# Patient Record
Sex: Female | Born: 1965
Health system: Southern US, Community
[De-identification: ages and names within clinical notes are randomized; demographics above are authoritative.]

## PROBLEM LIST (undated history)

## (undated) DIAGNOSIS — R32 Unspecified urinary incontinence: Secondary | ICD-10-CM

## (undated) DIAGNOSIS — J45909 Unspecified asthma, uncomplicated: Secondary | ICD-10-CM

## (undated) DIAGNOSIS — I447 Left bundle-branch block, unspecified: Secondary | ICD-10-CM

## (undated) DIAGNOSIS — N939 Abnormal uterine and vaginal bleeding, unspecified: Secondary | ICD-10-CM

## (undated) DIAGNOSIS — E7800 Pure hypercholesterolemia, unspecified: Secondary | ICD-10-CM

## (undated) DIAGNOSIS — L309 Dermatitis, unspecified: Secondary | ICD-10-CM

## (undated) HISTORY — DX: Pure hypercholesterolemia, unspecified: E78.00

## (undated) HISTORY — DX: Dermatitis, unspecified: L30.9

## (undated) HISTORY — DX: Unspecified asthma, uncomplicated: J45.909

## (undated) HISTORY — DX: Unspecified urinary incontinence: R32

## (undated) HISTORY — DX: Abnormal uterine and vaginal bleeding, unspecified: N93.9

## (undated) HISTORY — DX: Left bundle-branch block, unspecified: I44.7

---

## 1994-04-28 HISTORY — PX: APPENDECTOMY: SHX54

## 2016-08-03 DIAGNOSIS — R7303 Prediabetes: Secondary | ICD-10-CM | POA: Insufficient documentation

## 2017-01-30 DIAGNOSIS — E041 Nontoxic single thyroid nodule: Secondary | ICD-10-CM | POA: Insufficient documentation

## 2017-06-22 ENCOUNTER — Ambulatory Visit: Payer: Self-pay | Admitting: Family Medicine

## 2017-06-22 ENCOUNTER — Ambulatory Visit: Payer: Self-pay | Admitting: Internal Medicine

## 2017-07-01 ENCOUNTER — Other Ambulatory Visit: Payer: Self-pay

## 2017-07-01 ENCOUNTER — Encounter: Payer: Self-pay | Admitting: Internal Medicine

## 2017-07-01 ENCOUNTER — Ambulatory Visit (INDEPENDENT_AMBULATORY_CARE_PROVIDER_SITE_OTHER): Payer: No Typology Code available for payment source | Admitting: Internal Medicine

## 2017-07-01 VITALS — BP 110/72 | HR 68 | Temp 98.3°F | Ht 67.0 in | Wt 140.8 lb

## 2017-07-01 DIAGNOSIS — H8110 Benign paroxysmal vertigo, unspecified ear: Secondary | ICD-10-CM

## 2017-07-01 DIAGNOSIS — F32A Depression, unspecified: Secondary | ICD-10-CM | POA: Insufficient documentation

## 2017-07-01 DIAGNOSIS — J45909 Unspecified asthma, uncomplicated: Secondary | ICD-10-CM | POA: Diagnosis not present

## 2017-07-01 DIAGNOSIS — M7502 Adhesive capsulitis of left shoulder: Secondary | ICD-10-CM

## 2017-07-01 DIAGNOSIS — N898 Other specified noninflammatory disorders of vagina: Secondary | ICD-10-CM | POA: Diagnosis not present

## 2017-07-01 DIAGNOSIS — F329 Major depressive disorder, single episode, unspecified: Secondary | ICD-10-CM | POA: Diagnosis not present

## 2017-07-01 MED ORDER — SERTRALINE HCL 100 MG PO TABS: 100.0000 mg | ORAL_TABLET | Freq: Every day | ORAL | 3 refills | Status: DC

## 2017-07-01 MED ORDER — MICONAZOLE NITRATE 2 % VA CREA: 1.0000 | TOPICAL_CREAM | Freq: Every day | VAGINAL | 0 refills | Status: DC

## 2017-07-01 NOTE — Patient Instructions (Addendum)
Please return for an injection of your left shoulder for pain relief. I have put in the referral for physical therapy.   I have prescribed Miconazole cream to use nightly for 7 nights for a presumed yeast infection.   How to Perform the Epley Maneuver The Epley maneuver is an exercise that relieves symptoms of vertigo. Vertigo is the feeling that you or your surroundings are moving when they are not. When you feel vertigo, you may feel like the room is spinning and have trouble walking. Dizziness is a little different than vertigo. When you are dizzy, you may feel unsteady or light-headed. You can do this maneuver at home whenever you have symptoms of vertigo. You can do it up to 3 times a day until your symptoms go away. Even though the Epley maneuver may relieve your vertigo for a few weeks, it is possible that your symptoms will return. This maneuver relieves vertigo, but it does not relieve dizziness. What are the risks? If it is done correctly, the Epley maneuver is considered safe. Sometimes it can lead to dizziness or nausea that goes away after a short time. If you develop other symptoms, such as changes in vision, weakness, or numbness, stop doing the maneuver and call your health care provider. How to perform the Epley maneuver 1. Sit on the edge of a bed or table with your back straight and your legs extended or hanging over the edge of the bed or table. 2. Turn your head halfway toward the affected ear or side. 3. Lie backward quickly with your head turned until you are lying flat on your back. You may want to position a pillow under your shoulders. 4. Hold this position for 30 seconds. You may experience an attack of vertigo. This is normal. 5. Turn your head to the opposite direction until your unaffected ear is facing the floor. 6. Hold this position for 30 seconds. You may experience an attack of vertigo. This is normal. Hold this position until the vertigo stops. 7. Turn your whole  body to the same side as your head. Hold for another 30 seconds. 8. Sit back up. You can repeat this exercise up to 3 times a day. Follow these instructions at home:  After doing the Epley maneuver, you can return to your normal activities.  Ask your health care provider if there is anything you should do at home to prevent vertigo. He or she may recommend that you: ? Keep your head raised (elevated) with two or more pillows while you sleep. ? Do not sleep on the side of your affected ear. ? Get up slowly from bed. ? Avoid sudden movements during the day. ? Avoid extreme head movement, like looking up or bending over. Contact a health care provider if:  Your vertigo gets worse.  You have other symptoms, including: ? Nausea. ? Vomiting. ? Headache. Get help right away if:  You have vision changes.  You have a severe or worsening headache or neck pain.  You cannot stop vomiting.  You have new numbness or weakness in any part of your body. Summary  Vertigo is the feeling that you or your surroundings are moving when they are not.  The Epley maneuver is an exercise that relieves symptoms of vertigo.  If the Epley maneuver is done correctly, it is considered safe. You can do it up to 3 times a day. This information is not intended to replace advice given to you by your health care provider.  Make sure you discuss any questions you have with your health care provider. Document Released: 04/19/2013 Document Revised: 03/04/2016 Document Reviewed: 03/04/2016 Elsevier Interactive Patient Education  2017 [employer].

## 2017-07-01 NOTE — Progress Notes (Signed)
Subjective:    Autumn CrandallYasemin Brown - 52 y.o. female MRN [id number]  Date of birth: 1966-03-04  HPI  Autumn CrandallYasemin Brown is here to establish care.   Shoulder Pain: Chronic, left sided. Has been followed by PT for adhesive capsulitis. Still with limited ROM at left shoulder. Had injection done several months ago. Requests new referral for PT.   Dizziness: Has been occurring intermitently for several months. Sensation of room spinning when she changes position of her head quickly. No lightheadedness, headache, palpitations, nausea/vomiting, hearing loss, vision changes, weakness in an extremity. No head trauma, falls, or LOC. Has not tried any medications.   Vaginal Itching: Reports vaginal itching with white discharge for the past few weeks. She was treated with Metronidazole cream fairly recently. No abnormal uterine bleeding, dysuria, pelvic pain. Mirena in place.    PMH: Depression Asthma  Medications: Zoloft 100 mg daily   Social Hx:  Married. Two daughters.  Never smoker.  No EtOH or drug use.  No exercise.  Feels safe.  Not at risk for STD.   Surgical Hx:  Appendectomy 1996   Family Hx: Noted and updated in EMR.   ROS:  Patient reports no  vision/ hearing changes,anorexia, weight change, fever ,adenopathy, persistant / recurrent hoarseness, swallowing issues, chest pain, edema,persistant / recurrent cough, hemoptysis, dyspnea(rest, exertional, paroxysmal nocturnal), gastrointestinal  bleeding (melena, rectal bleeding), abdominal pain, excessive heart burn, GU symptoms(dysuria, hematuria, pyuria, voiding/incontinence  Issues) syncope, focal weakness, severe memory loss, concerning skin lesions, depression, anxiety, abnormal bruising/bleeding, major joint swelling, breast masses or abnormal vaginal bleeding.    -  reports that  has never smoked. she has never used smokeless tobacco. - Review of Systems: Per HPI. - Past Medical History: Patient Active Problem List   Diagnosis  Date Noted  . Depression 07/01/2017  . Asthma 07/01/2017   - Medications: reviewed and updated   Objective:   Physical Exam BP 110/72   Pulse 68   Temp 98.3 F (36.8 C) (Oral)   Ht 5\' 7"  (1.702 m)   Wt 140 lb 12.8 oz (63.9 kg)   SpO2 98%   BMI 22.05 kg/m  Gen: NAD, alert, cooperative with exam, well-appearing HEENT: NCAT, PERRL, clear conjunctiva, oropharynx clear, supple neck CV: RRR, good S1/S2, no murmur, no edema, capillary refill brisk  Resp: CTABL, no wheezes, non-labored Abd: SNTND, BS present, no guarding or organomegaly Skin: no rashes, normal turgor  MSK: Active and passive ROM limited in abduction and internal rotation.  Neuro: CN II-XII grossly intact. Strength 5/5 in all extremities. Sensation to all extremities grossly normal. Dix-Hallpike position to right.  GU/GYN: Declined.  Psych: good insight, alert and oriented     Assessment & Plan:   1. Adhesive capsulitis of left shoulder Have recommended patient return for steroid injection. Discussed typical timeline and slow healing of adhesive capsulitis. PT referral placed.  - Ambulatory referral to Physical Therapy  2. Vaginal itching Patient declined exam. Symptoms sound most consistent with yeast infection especially given recent treatment for BV. Will treat as such.  - miconazole (MICONAZOLE 7) 2 % vaginal cream; Place 1 Applicatorful vaginally at bedtime.  Dispense: 45 g; Refill: 0  3. Benign paroxysmal positional vertigo, unspecified laterality History and exam consistent with BPPV. No red flags on history. Low suspicion for intracranial process due to normal neuro exam. Cerebellar stroke also unlikely given normal gait and neurological assessment. Recommended at home Epley maneuvers.    Marcy Sirenatherine Wallace, D.O. 07/01/2017, 1:35 PM PGY-3, [facility]

## 2017-07-08 ENCOUNTER — Ambulatory Visit: Payer: No Typology Code available for payment source | Admitting: Internal Medicine

## 2017-07-14 ENCOUNTER — Ambulatory Visit: Payer: No Typology Code available for payment source

## 2017-07-22 ENCOUNTER — Ambulatory Visit: Payer: No Typology Code available for payment source | Admitting: Physical Therapy

## 2017-07-27 ENCOUNTER — Ambulatory Visit: Payer: No Typology Code available for payment source | Admitting: Internal Medicine

## 2017-07-27 ENCOUNTER — Encounter: Payer: Self-pay | Admitting: Internal Medicine

## 2017-07-27 ENCOUNTER — Ambulatory Visit (INDEPENDENT_AMBULATORY_CARE_PROVIDER_SITE_OTHER): Payer: No Typology Code available for payment source | Admitting: Internal Medicine

## 2017-07-27 ENCOUNTER — Other Ambulatory Visit: Payer: Self-pay

## 2017-07-27 VITALS — BP 104/66 | HR 78 | Temp 98.8°F | Ht 67.0 in | Wt 140.8 lb

## 2017-07-27 DIAGNOSIS — M25561 Pain in right knee: Secondary | ICD-10-CM | POA: Diagnosis not present

## 2017-07-27 DIAGNOSIS — M25512 Pain in left shoulder: Secondary | ICD-10-CM | POA: Diagnosis not present

## 2017-07-27 MED ORDER — SERTRALINE HCL 100 MG PO TABS: 100.0000 mg | ORAL_TABLET | Freq: Every day | ORAL | 3 refills | Status: DC

## 2017-07-27 MED ORDER — METHYLPREDNISOLONE ACETATE 40 MG/ML IJ SUSP
40.0000 mg | Freq: Once | INTRAMUSCULAR | Status: AC
  Administered 2017-07-27: 40 mg via INTRAMUSCULAR

## 2017-07-27 MED ORDER — TRAMADOL HCL 50 MG PO TABS: 50.0000 mg | ORAL_TABLET | Freq: Three times a day (TID) | ORAL | 0 refills | Status: DC | PRN

## 2017-07-27 MED FILL — traMADol HCL 50 MG TABS: 50 | 7 days supply | Qty: 20 | Fill #0

## 2017-07-27 MED FILL — SERTRALINE HCL 100 MG TAB: 100 | 90 days supply | Qty: 90 | Fill #0

## 2017-07-27 NOTE — Progress Notes (Signed)
   Subjective:    Autumn Brown - 52 y.o. female MRN [id number]  Date of birth: 1965/10/20  HPI  Autumn CrandallYasemin Lefebre is here for injection of left shoulder for known adhesive capsulitis discussed at previous visit. Also brings up right knee pain.   Right Knee Pain: Present for the past two weeks. Reports intermittent pain in that knee for the past 2 years. Located in the medial aspect of the knee. Per patient, has history of right medial meniscal tear not requiring surgical repair. No known inciting event or injury two weeks ago that caused flare up of pain. Endorses stiffness of the joint. No popping, locking, giving way. No edema. Strength of right LE preserved. Denies numbness or tingling of right LE. Has been using OTC Ibuprofen but it causes stomach pain and OTC tylenol. Asks for other options for medications.   -  reports that she has never smoked. She has never used smokeless tobacco. - Review of Systems: Per HPI. - Past Medical History: Patient Active Problem List   Diagnosis Date Noted  . Depression 07/01/2017  . Asthma 07/01/2017   - Medications: reviewed and updated   Objective:   Physical Exam BP 104/66   Pulse 78   Temp 98.8 F (37.1 C) (Oral)   Ht 5\' 7"  (1.702 m)   Wt 140 lb 12.8 oz (63.9 kg)   SpO2 98%   BMI 22.05 kg/m  Gen: NAD, alert, cooperative with exam, well-appearing Right Knee: No edema, erythema, or increased warmth. No TTP over the joint lines. Crepitus present with passive ROM. Active ROM is intact. Negative anterior drawer test. No pain with valgus or varus stress testing with stable endpoints to the MCL and LCL. Negative Thessaly's. Strength 5/5 in LE bilaterally. Sensation to LE grossly intact. Normal gait.   INJECTION:  Patient was given informed consent, signed copy in the chart. Appropriate time out was taken. Area prepped and draped in usual sterile fashion. One cc of methylprednisolone 40 mg/ml plus four cc of 1% lidocaine without epinephrine was  injected into the left shoulder joint using a posterior approach. The patient tolerated the procedure well. There were no complications. Post procedure instructions were given.      Assessment & Plan:   1. Acute on chronic pain of right knee Likely due to degenerative mensicus vs. OA. Offered patient baseline x-ray of knee, she declined. Given acute flare of pain, a short course of Tramadol is reasonable. Would typically try long acting NSAID but patient endorses significant GI distress with OTC Ibuprofen use. Discussed that will not routinely prescribe Tramadol for patient. Recommended addition of PT for knee. Discussed ice and bracing knee for stability.  - traMADol (ULTRAM) 50 MG tablet; Take 1 tablet (50 mg total) by mouth every 8 (eight) hours as needed.  Dispense: 20 tablet; Refill: 0  2. Pain in joint of left shoulder Known adhesive capsulitis. Have referred to PT at previous appointment. CSI performed today; tolerated well by patient.  - methylPREDNISolone acetate (DEPO-MEDROL) injection 40 mg   Marcy Sirenatherine Shunsuke Granzow, D.O. 07/27/2017, 12:16 PM PGY-3, [facility]

## 2017-10-23 ENCOUNTER — Ambulatory Visit (INDEPENDENT_AMBULATORY_CARE_PROVIDER_SITE_OTHER): Payer: No Typology Code available for payment source

## 2017-10-23 ENCOUNTER — Other Ambulatory Visit: Payer: Self-pay | Admitting: Family Medicine

## 2017-10-23 DIAGNOSIS — Z9229 Personal history of other drug therapy: Secondary | ICD-10-CM

## 2017-10-23 NOTE — Progress Notes (Signed)
   Patient in to nurse clinic; scheduled for PPD. Patient is originally from [place] and has had BCG vaccination x 2. Always has some induration on skin test. [facility] and requests that now. Spoke with preceptor, Dr. Leveda AnnaHensel, who entered order. Patient taken to lab. Ples SpecterAlisa Brake, RN [facility] RN)

## 2017-10-29 LAB — QUANTIFERON-TB GOLD PLUS
QUANTIFERON NIL VALUE: 0.16 [IU]/mL
QUANTIFERON TB1 AG VALUE: 0.18 [IU]/mL
QuantiFERON Mitogen Value: >10 IU/mL
QuantiFERON TB2 Ag Value: 0.12 IU/mL
QuantiFERON-TB Gold Plus: NEGATIVE

## 2017-11-02 ENCOUNTER — Ambulatory Visit (INDEPENDENT_AMBULATORY_CARE_PROVIDER_SITE_OTHER): Payer: No Typology Code available for payment source | Admitting: Family Medicine

## 2017-11-02 ENCOUNTER — Encounter: Payer: Self-pay | Admitting: Family Medicine

## 2017-11-02 ENCOUNTER — Other Ambulatory Visit: Payer: Self-pay

## 2017-11-02 VITALS — BP 102/60 | HR 70 | Temp 97.8°F | Ht 66.0 in | Wt 142.4 lb

## 2017-11-02 DIAGNOSIS — K6289 Other specified diseases of anus and rectum: Secondary | ICD-10-CM | POA: Diagnosis not present

## 2017-11-02 DIAGNOSIS — K582 Mixed irritable bowel syndrome: Secondary | ICD-10-CM

## 2017-11-02 DIAGNOSIS — M7502 Adhesive capsulitis of left shoulder: Secondary | ICD-10-CM | POA: Diagnosis not present

## 2017-11-02 DIAGNOSIS — K589 Irritable bowel syndrome without diarrhea: Secondary | ICD-10-CM | POA: Insufficient documentation

## 2017-11-02 NOTE — Patient Instructions (Signed)
Go to physical therapy for your shoulder and be sure to do the exercises that they give you at home!  A referral has been placed for GI in [place].  They will contact you to set up an appointment.  Please remember to keep a diary of your food and stomach pain and bowel movements.  Also include in the diary if you are bleeding.  Drink Metamucil, or another fiber supplement once a day. Continue your probiotic.  You can sit in a warm bath to help relieve some of your rectal pain.  It was great to meet you today!  Dr. Sindy Messingittberger

## 2017-11-02 NOTE — Progress Notes (Signed)
Subjective:    Patient ID: Autumn Brown, female    DOB: July 15, 1965, 52 y.o.   MRN: [id number]  Neck Pain   Pertinent negatives include no fever or weight loss.   Autumn Brown is a 52 yo female who presents with complaints of left shoulder pain and anal pain with occasional bright red blood per rectum.  Left shoulder pain Patient complains of many years of decreased ROM in left shoulder and pain with ROM.  She states that she has been diagnosed with adhesive capsulitis in the past and has received three steroid injections, with the most recent being in April 2019.  She states that the injections improve her pain.  She has had physical therapy in the past, but has changed insurance recently and has not been back.  She is requesting a physical therapy referral close to her home in [place] today.  She has an MRI from 2018 that is negative for rotator cuff pathology and supported diagnosis of adhesive capsulitis.  Anal Pain Patient complains of occasional pain with defecation that she describes at "deep inside" and occasional bright red blood when she wipes.  She notes her last colonoscopy was about 3 years ago.  She denies night sweats, changes in weight.  She has been using topical steroid cream with some relief.  She also states that she been experiencing many years of alternating diarrhea, constipation, bloating, and abdominal cramping.  She is requesting a referral to GI.   Review of Systems  Constitutional: Negative for chills, fever and weight loss.  Gastrointestinal: Positive for abdominal pain, blood in stool, constipation and diarrhea. Negative for nausea and vomiting.  Genitourinary: Negative for dysuria and hematuria.  Musculoskeletal: Positive for joint pain and neck pain.         Objective:   Physical Exam  Constitutional: She is oriented to person, place, and time. She appears well-developed and well-nourished. No distress.  HENT:  Head: Normocephalic and atraumatic.    Cardiovascular: Normal rate and regular rhythm. Exam reveals no gallop and no friction rub.  No murmur heard. Pulmonary/Chest: Effort normal and breath sounds normal. No respiratory distress. She has no wheezes. She has no rales.  Abdominal: Soft. There is tenderness (diffuse). There is no guarding.  Genitourinary:  Genitourinary Comments: Patient defers rectal exam  Musculoskeletal:       Right shoulder: Normal. She exhibits normal range of motion.       Left shoulder: She exhibits decreased range of motion (in abduction, external rotation, internal rotation active and passive). She exhibits no tenderness and no swelling.  Positive Hawkins-Kennedy on left  Neurological: She is alert and oriented to person, place, and time.   Vitals:   11/02/17 1403  BP: 102/60  Pulse: 70  Temp: 97.8 F (36.6 C)  SpO2: 98%          Assessment & Plan:   Adhesive capsulitis of left shoulder Patient has a history of adhesive capsulitis.  Has received three steroid injections in the past with the most recent in April 2019.  She has also had physical therapy in the past, but since changing insurances, has not been back.  Active and Passive ROM decreased in abduction, external rotation, and internal rotation on the left.   -Will reorder physical therapy and consider another steroid injection at a later time if indicated.  Evidence suggests that steroid injections and physical therapy together have the best efficacy.  Will re-assess at next visit.   -Patient educated on the  risks and benefits of steroid injections.  Anal or rectal pain Patient notes a history of bright red blood per rectum with about every other defecation.  She states that she has used steroid cream with minimal relief of pain.  Also notes pain associated with defecation that she describes as "deep inside."   -Encouraged to keep a diary of bleeding and pain associated with a food diary.  Will re-assess at next visit.  -Patient  requesting GI referral.   -Increase fiber intake, take metamucil daily.   -Advised that she can try warm sitz bath for relief of pain and improvement of symptoms.  Return to office for follow up of shoulder pain and anal pain in 3 months.

## 2017-11-02 NOTE — Assessment & Plan Note (Addendum)
Patient notes a history of bright red blood per rectum with about every other defecation.  She states that she has used steroid cream with minimal relief of pain.  Also notes pain associated with defecation that she describes as "deep inside."   -Encouraged to keep a diary of bleeding and pain associated with a food diary.  Will re-assess at next visit.  -Patient requesting GI referral.   -Increase fiber intake, take metamucil daily.   -Advised that she can try warm sitz bath for relief of pain and improvement of symptoms.

## 2017-11-02 NOTE — Assessment & Plan Note (Addendum)
Patient has a history of adhesive capsulitis.  Has received three steroid injections in the past with the most recent in April 2019.  She has also had physical therapy in the past, but since changing insurances, has not been back.  Active and Passive ROM decreased in abduction, external rotation, and internal rotation on the left.   - [facility] [place] [facility]. -Consider another steroid injection at a later time if indicated.  Evidence suggests that steroid injections and physical therapy together have the best efficacy.  Will re-assess at next visit.   -Patient educated on the risks and benefits of steroid injections.

## 2017-11-28 ENCOUNTER — Encounter: Payer: Self-pay | Admitting: Family Medicine

## 2017-12-07 ENCOUNTER — Other Ambulatory Visit: Payer: Self-pay | Admitting: Family Medicine

## 2017-12-07 DIAGNOSIS — H8110 Benign paroxysmal vertigo, unspecified ear: Secondary | ICD-10-CM

## 2017-12-07 DIAGNOSIS — R198 Other specified symptoms and signs involving the digestive system and abdomen: Secondary | ICD-10-CM

## 2017-12-07 DIAGNOSIS — F32A Depression, unspecified: Secondary | ICD-10-CM

## 2017-12-07 DIAGNOSIS — F329 Major depressive disorder, single episode, unspecified: Secondary | ICD-10-CM

## 2017-12-07 MED ORDER — HYDROCORTISONE 2.5 % RE CREA: 1.0000 "application " | TOPICAL_CREAM | Freq: Two times a day (BID) | RECTAL | 0 refills | Status: DC

## 2017-12-07 MED ORDER — SERTRALINE HCL 100 MG PO TABS: 100.0000 mg | ORAL_TABLET | Freq: Every day | ORAL | 1 refills | Status: DC

## 2017-12-07 MED FILL — SERTRALINE HCL 100 MG TAB: 100 | 90 days supply | Qty: 90 | Fill #0

## 2017-12-07 MED FILL — PROCTOZONE-HC 2.5 % CREA: 2.5 | 15 days supply | Qty: 30 | Fill #0

## 2017-12-11 ENCOUNTER — Ambulatory Visit: Payer: No Typology Code available for payment source | Admitting: Gastroenterology

## 2017-12-18 ENCOUNTER — Encounter: Payer: Self-pay | Admitting: Family Medicine

## 2017-12-18 ENCOUNTER — Ambulatory Visit (INDEPENDENT_AMBULATORY_CARE_PROVIDER_SITE_OTHER): Payer: No Typology Code available for payment source | Admitting: Family Medicine

## 2017-12-18 VITALS — BP 110/70 | HR 72 | Ht 66.0 in | Wt 142.0 lb

## 2017-12-18 DIAGNOSIS — Z7689 Persons encountering health services in other specified circumstances: Secondary | ICD-10-CM | POA: Diagnosis not present

## 2017-12-18 DIAGNOSIS — M7502 Adhesive capsulitis of left shoulder: Secondary | ICD-10-CM

## 2017-12-18 DIAGNOSIS — E042 Nontoxic multinodular goiter: Secondary | ICD-10-CM

## 2017-12-18 DIAGNOSIS — F329 Major depressive disorder, single episode, unspecified: Secondary | ICD-10-CM

## 2017-12-18 DIAGNOSIS — R1084 Generalized abdominal pain: Secondary | ICD-10-CM | POA: Diagnosis not present

## 2017-12-18 DIAGNOSIS — K645 Perianal venous thrombosis: Secondary | ICD-10-CM

## 2017-12-18 LAB — POCT URINALYSIS DIPSTICK
Bilirubin, UA: NEGATIVE
Glucose, UA: NEGATIVE
Ketones, UA: NEGATIVE
LEUKOCYTES UA: NEGATIVE
NITRITE UA: NEGATIVE
PH UA: 5 (ref 5.0–8.0)
PROTEIN UA: NEGATIVE
RBC UA: NEGATIVE
Spec Grav, UA: 1.01 (ref 1.010–1.025)
UROBILINOGEN UA: 0.2 U/dL

## 2017-12-18 MED ORDER — HYDROCORTISONE ACETATE 25 MG RE SUPP
25.0000 mg | Freq: Two times a day (BID) | RECTAL | 0 refills | Status: DC
Start: 2017-12-18 — End: 2018-01-11

## 2017-12-18 NOTE — Progress Notes (Addendum)
Name: Autumn Brown   MRN: [id number]    DOB: 01-31-1966   Date:12/18/2017       Progress Note  Subjective  Chief Complaint  Chief Complaint  Patient presents with  . Establish Care  . Hemorrhoids    last 3 months- bleeding and hurting. External- tried to push it back in with fingers. Anusol not really helping    Patient is a 52 year old female who presents for a comprehensive physical exam. The patient reports the following problems: hemorrhoids/. Health maintenance has been reviewed mammogram.  GI Problem  The primary symptoms include hematochezia. Primary symptoms do not include fever, weight loss, fatigue, abdominal pain, nausea, vomiting, diarrhea, melena, hematemesis, jaundice, dysuria, myalgias, arthralgias or rash. Primary symptoms comment: for hemorrhoid.  The illness does not include chills, constipation or back pain. Significant associated medical issues include irritable bowel syndrome. Associated medical issues do not include inflammatory bowel disease, GERD, gallstones, alcohol abuse, PUD, gastric bypass, bowel resection, hemorrhoids or diverticulitis.  Abdominal Pain  This is a chronic problem. The current episode started more than 1 year ago. The problem occurs intermittently. The pain is located in the generalized abdominal region. The pain is at a severity of 5/10. The pain is moderate. The quality of the pain is aching. The abdominal pain radiates to the back. Associated symptoms include belching and hematochezia. Pertinent negatives include no arthralgias, constipation, diarrhea, dysuria, fever, frequency, headaches, hematuria, melena, myalgias, nausea, vomiting or weight loss. Nothing aggravates the pain. The pain is relieved by nothing. Her past medical history is significant for irritable bowel syndrome. There is no history of gallstones, GERD or PUD.    Depression Followed by psychiatry. Presently on sertraline.  Adhesive capsulitis of left shoulder History of  adhesive capsulitis. Taken nsaid in past   History reviewed. No pertinent past medical history.  Past Surgical History:  Procedure Laterality Date  . APPENDECTOMY  1996    Family History  Problem Relation Age of Onset  . Diabetes Mother   . Rectal cancer Maternal Grandmother   . Lung cancer Paternal Aunt     Social History   Socioeconomic History  . Marital status: Married    Spouse name: Not on file  . Number of children: Not on file  . Years of education: Not on file  . Highest education level: Not on file  Occupational History  . Not on file  Social Needs  . Financial resource strain: Not on file  . Food insecurity:    Worry: Not on file    Inability: Not on file  . Transportation needs:    Medical: Not on file    Non-medical: Not on file  Tobacco Use  . Smoking status: Never Smoker  . Smokeless tobacco: Never Used  Substance and Sexual Activity  . Alcohol use: No    Frequency: Never  . Drug use: No  . Sexual activity: Yes    Partners: Male  Lifestyle  . Physical activity:    Days per week: Not on file    Minutes per session: Not on file  . Stress: Not on file  Relationships  . Social connections:    Talks on phone: Not on file    Gets together: Not on file    Attends religious service: Not on file    Active member of club or organization: Not on file    Attends meetings of clubs or organizations: Not on file    Relationship status: Not on file  .  Intimate partner violence:    Fear of current or ex partner: Not on file    Emotionally abused: Not on file    Physically abused: Not on file    Forced sexual activity: Not on file  Other Topics Concern  . Not on file  Social History Narrative  . Not on file    No Known Allergies  Outpatient Medications Prior to Visit  Medication Sig Dispense Refill  . hydrocortisone (ANUSOL-HC) 2.5 % rectal cream Place 1 application rectally 2 (two) times daily. 30 g 0  . sertraline (ZOLOFT) 100 MG tablet Take 1  tablet (100 mg total) by mouth daily. 90 tablet 1  . miconazole (MICONAZOLE 7) 2 % vaginal cream Place 1 Applicatorful vaginally at bedtime. 45 g 0  . traMADol (ULTRAM) 50 MG tablet Take 1 tablet (50 mg total) by mouth every 8 (eight) hours as needed. (Patient taking differently: Take by mouth every 8 (eight) hours as needed. ) 20 tablet 0   No facility-administered medications prior to visit.     Review of Systems  Constitutional: Negative for chills, fatigue, fever, malaise/fatigue and weight loss.  HENT: Negative for ear discharge, ear pain and sore throat.   Eyes: Negative for blurred vision.  Respiratory: Negative for cough, sputum production, shortness of breath and wheezing.   Cardiovascular: Negative for chest pain, palpitations and leg swelling.  Gastrointestinal: Positive for hematochezia. Negative for abdominal pain, blood in stool, constipation, diarrhea, heartburn, hematemesis, jaundice, melena, nausea and vomiting.  Genitourinary: Negative for dysuria, frequency, hematuria and urgency.  Musculoskeletal: Negative for arthralgias, back pain, joint pain, myalgias and neck pain.  Skin: Negative for rash.  Neurological: Negative for dizziness, tingling, sensory change, focal weakness and headaches.  Endo/Heme/Allergies: Negative for environmental allergies and polydipsia. Does not bruise/bleed easily.  Psychiatric/Behavioral: Negative for depression and suicidal ideas. The patient is not nervous/anxious and does not have insomnia.      Objective  Vitals:   12/18/17 1513  BP: 110/70  Pulse: 72  Weight: 142 lb (64.4 kg)  Height: 5\' 6"  (1.676 m)    Physical Exam  Constitutional: No distress.  HENT:  Head: Normocephalic and atraumatic.  Right Ear: External ear normal.  Left Ear: External ear normal.  Nose: Nose normal.  Mouth/Throat: Oropharynx is clear and moist.  Eyes: Pupils are equal, round, and reactive to light. Conjunctivae and EOM are normal. Right eye exhibits  no discharge. Left eye exhibits no discharge.  Neck: Normal range of motion. Neck supple. No JVD present. No thyromegaly present.  Cardiovascular: Normal rate, regular rhythm, normal heart sounds and intact distal pulses. Exam reveals no gallop and no friction rub.  No murmur heard. Pulmonary/Chest: Effort normal and breath sounds normal. She has no wheezes.  Abdominal: Soft. Bowel sounds are normal. She exhibits no mass. There is no tenderness. There is no guarding.  Musculoskeletal: Normal range of motion. She exhibits no edema.  Lymphadenopathy:    She has no cervical adenopathy.  Neurological: She is alert. She has normal reflexes.  Skin: Skin is warm and dry. She is not diaphoretic.  Nursing note and vitals reviewed.     Assessment & Plan  Problem List Items Addressed This Visit      Musculoskeletal and Integument   Adhesive capsulitis of left shoulder    History of adhesive capsulitis. Taken nsaid in past        Other   Depression    Followed by psychiatry. Presently on sertraline.  Other Visit Diagnoses    Establishing care with new doctor, encounter for    -  Primary   Generalized abdominal pain       HX ibs/gall bladder polyps/and ibs. Will proceed when hemorroids addressed.   Relevant Orders   POCT Urinalysis Dipstick (Completed)   Multiple thyroid nodules       Patient not corncerned at present. Needs hemorrhoids addressed.   Hemorrhoids, external, thrombosed       Thrombosed and extensive. Referral to general surgery   Relevant Medications   hydrocortisone (ANUSOL-HC) 25 MG suppository   Other Relevant Orders   Ambulatory referral to General Surgery   Ambulatory referral to Gastroenterology      Meds ordered this encounter  Medications  . hydrocortisone (ANUSOL-HC) 25 MG suppository    Sig: Place 1 suppository (25 mg total) rectally 2 (two) times daily.    Dispense:  12 suppository    Refill:  0      Dr. Elizabeth Sauer [facility]  12/18/17

## 2017-12-18 NOTE — Assessment & Plan Note (Signed)
Followed by psychiatry. Presently on sertraline.

## 2017-12-18 NOTE — Addendum Note (Signed)
Addended by: Duanne LimerickJONES, DEANNA C on: 12/18/2017 05:05 PM   Modules accepted: Orders

## 2017-12-18 NOTE — Assessment & Plan Note (Signed)
History of adhesive capsulitis. Taken nsaid in past

## 2017-12-18 NOTE — Patient Instructions (Signed)
Hydrocortisone suppositories What is this medicine? HYDROCORTISONE (hye droe KOR ti sone) is a corticosteroid. It is used to decrease swelling, itching, and pain that is caused by minor skin irritations or by hemorrhoids. This medicine may be used for other purposes; ask your health care provider or pharmacist if you have questions. COMMON BRAND NAME(S): Anucort-HC, Anumed-HC, Anusol HC, Encort, GRx HiCort, Hemmorex-HC, Hemorrhoidal-HC, Hemril, Proctocort, Proctosert HC, Proctosol-HC, Rectacort HC, Rectasol-HC What should I tell my health care provider before I take this medicine? They need to know if you have any of these conditions: -an unusual or allergic reaction to hydrocortisone, corticosteroids, other medicines, foods, dyes, or preservatives -pregnant or trying to get pregnant -breast-feeding How should I use this medicine? This medicine is for rectal use only. Do not take by mouth. Wash your hands before and after use. Take off the foil wrapping. Wet the tip of the suppository with cold tap water to make it easier to use. Lie on your side with your lower leg straightened out and your upper leg bent forward toward your stomach. Lift upper buttock to expose the rectal area. Apply gentle pressure to insert the suppository completely into the rectum, pointed end first. Hold buttocks together for a few seconds. Remain lying down for about 15 minutes to avoid having the suppository come out. Do not use more often than directed. Talk to your pediatrician regarding the use of this medicine in children. Special care may be needed. Overdosage: If you think you have taken too much of this medicine contact a poison control center or emergency room at once. NOTE: This medicine is only for you. Do not share this medicine with others. What if I miss a dose? If you miss a dose, use it as soon as you can. If it is almost time for your next dose, use only that dose. Do not use double or extra doses. What may  interact with this medicine? Interactions are not expected. Do not use any other rectal products on the affected area without telling your doctor or health care professional. This list may not describe all possible interactions. Give your health care provider a list of all the medicines, herbs, non-prescription drugs, or dietary supplements you use. Also tell them if you smoke, drink alcohol, or use illegal drugs. Some items may interact with your medicine. What should I watch for while using this medicine? Visit your doctor or health care professional for regular checks on your progress. Tell your doctor or health care professional if your symptoms do not improve after a few days of use. Do not use if there is blood in your stools. If you get any type of infection while using this medicine, you may need to stop using this medicine until our infections clears up. Ask your doctor or health care professional for advice. What side effects may I notice from receiving this medicine? Side effects that you should report to your doctor or health care professional as soon as possible: -bloody or black, tarry stools -painful, red, pus filled blisters in hair follicles -rectal pain, burning or bleeding after use of medicine Side effects that usually do not require medical attention (report to your doctor or health care professional if they continue or are bothersome): -changes in skin color -dry skin -itching or irritation This list may not describe all possible side effects. Call your doctor for medical advice about side effects. You may report side effects to FDA at 1-800-FDA-1088. Where should I keep my medicine? Keep out  of the reach of children. Store at room temperature between 20 and 25 degrees C (68 and 77 degrees F). Protect from heat and freezing. Throw away any unused medicine after the expiration date. NOTE: This sheet is a summary. It may not cover all possible information. If you have questions  about this medicine, talk to your doctor, pharmacist, or health care provider.  2018 Elsevier/Gold Standard (2007-08-27 16:07:24)  

## 2017-12-23 ENCOUNTER — Ambulatory Visit: Payer: No Typology Code available for payment source | Admitting: Gastroenterology

## 2018-01-05 ENCOUNTER — Ambulatory Visit: Payer: Self-pay | Admitting: Surgery

## 2018-01-10 ENCOUNTER — Encounter: Payer: Self-pay | Admitting: Family Medicine

## 2018-01-11 ENCOUNTER — Encounter: Payer: Self-pay | Admitting: Family Medicine

## 2018-01-11 ENCOUNTER — Ambulatory Visit (INDEPENDENT_AMBULATORY_CARE_PROVIDER_SITE_OTHER): Payer: No Typology Code available for payment source | Admitting: Family Medicine

## 2018-01-11 ENCOUNTER — Other Ambulatory Visit
Admission: RE | Admit: 2018-01-11 | Discharge: 2018-01-11 | Disposition: A | Discharge status: Home / Self Care | Payer: No Typology Code available for payment source | Source: Ambulatory Visit | Attending: Family Medicine | Admitting: Family Medicine

## 2018-01-11 VITALS — BP 102/60 | HR 64 | Ht 66.0 in | Wt 144.0 lb

## 2018-01-11 DIAGNOSIS — N920 Excessive and frequent menstruation with regular cycle: Secondary | ICD-10-CM | POA: Insufficient documentation

## 2018-01-11 DIAGNOSIS — N938 Other specified abnormal uterine and vaginal bleeding: Secondary | ICD-10-CM | POA: Diagnosis not present

## 2018-01-11 DIAGNOSIS — N3090 Cystitis, unspecified without hematuria: Secondary | ICD-10-CM | POA: Diagnosis not present

## 2018-01-11 DIAGNOSIS — N925 Other specified irregular menstruation: Secondary | ICD-10-CM | POA: Diagnosis not present

## 2018-01-11 LAB — POCT URINALYSIS DIPSTICK

## 2018-01-11 LAB — HEMOGLOBIN: Hemoglobin: 12.8 g/dL (ref 12.0–16.0)

## 2018-01-11 MED ORDER — NITROFURANTOIN MONOHYD MACRO 100 MG PO CAPS: 100.0000 mg | ORAL_CAPSULE | Freq: Two times a day (BID) | ORAL | 0 refills | Status: DC

## 2018-01-11 NOTE — Patient Instructions (Signed)
Abnormal Uterine Bleeding Abnormal uterine bleeding can affect women at various stages in life, including teenagers, women in their reproductive years, pregnant women, and women who have reached menopause. Several kinds of uterine bleeding are considered abnormal, including:  Bleeding or spotting between periods.  Bleeding after sexual intercourse.  Bleeding that is heavier or more than normal.  Periods that last longer than usual.  Bleeding after menopause. Many cases of abnormal uterine bleeding are minor and simple to treat, while others are more serious. Any type of abnormal bleeding should be evaluated by your health care provider. Treatment will depend on the cause of the bleeding. Follow these instructions at home: Monitor your condition for any changes. The following actions may help to alleviate any discomfort you are experiencing:  Avoid the use of tampons and douches as directed by your health care provider.  Change your pads frequently. You should get regular pelvic exams and Pap tests. Keep all follow-up appointments for diagnostic tests as directed by your health care provider. Contact a health care provider if:  Your bleeding lasts more than 1 week.  You feel dizzy at times. Get help right away if:  You pass out.  You are changing pads every 15 to 30 minutes.  You have abdominal pain.  You have a fever.  You become sweaty or weak.  You are passing large blood clots from the vagina.  You start to feel nauseous and vomit. This information is not intended to replace advice given to you by your health care provider. Make sure you discuss any questions you have with your health care provider. Document Released: 04/14/2005 Document Revised: 09/26/2015 Document Reviewed: 11/11/2012 Elsevier Interactive Patient Education  2017 Elsevier Inc.  

## 2018-01-11 NOTE — Progress Notes (Signed)
Name: Autumn Brown   MRN: [id number]    DOB: [birth year]   Date:01/11/2018       Progress Note  Subjective  Chief Complaint  Chief Complaint  Patient presents with  . Urinary Tract Infection    started this morning with pain/ pressure during urination- wants nitrofurantoin if is positive  . change in period    needs referral to obgyn- been bleeding x 6 days- IUD was placed by a doctor in [place]/ before leaving    Urinary Tract Infection   This is a new problem. The current episode started today. The problem has been unchanged. The quality of the pain is described as burning. The pain is at a severity of 4/10. The pain is mild. There has been no fever. She is sexually active. There is no history of pyelonephritis. Associated symptoms include frequency, hesitancy and urgency. Pertinent negatives include no chills, discharge, flank pain, hematuria, nausea, sweats or vomiting. Treatments tried: azo. The treatment provided mild relief. Her past medical history is significant for recurrent UTIs. There is no history of catheterization, kidney stones, a single kidney, urinary stasis or a urological procedure.  Vaginal Bleeding  The patient's primary symptoms include pelvic pain and vaginal bleeding. The patient's pertinent negatives include no genital itching, genital lesions, genital odor, genital rash or missed menses. This is a new problem. The current episode started 1 to 4 weeks ago (3months prior/vag bleeding 1 week ago/2-3 ppd). The problem occurs daily. The problem has been waxing and waning. The pain is mild (suprapubic pain 1 week). Associated symptoms include frequency and urgency. Pertinent negatives include no abdominal pain, back pain, chills, constipation, diarrhea, dysuria, fever, flank pain, headaches, hematuria, joint pain, nausea, rash, sore throat or vomiting. The vaginal discharge was bloody. She has not been passing clots. She has not been passing tissue. Nothing aggravates the  symptoms. She has tried nothing for the symptoms.    No problem-specific Assessment & Plan notes found for this encounter.   History reviewed. No pertinent past medical history.  Past Surgical History:  Procedure Laterality Date  . APPENDECTOMY  1996    Family History  Problem Relation Age of Onset  . Diabetes Mother   . Rectal cancer Maternal Grandmother   . Lung cancer Paternal Aunt     Social History   Socioeconomic History  . Marital status: Married    Spouse name: Not on file  . Number of children: Not on file  . Years of education: Not on file  . Highest education level: Not on file  Occupational History  . Not on file  Social Needs  . Financial resource strain: Not on file  . Food insecurity:    Worry: Not on file    Inability: Not on file  . Transportation needs:    Medical: Not on file    Non-medical: Not on file  Tobacco Use  . Smoking status: Never Smoker  . Smokeless tobacco: Never Used  Substance and Sexual Activity  . Alcohol use: No    Frequency: Never  . Drug use: No  . Sexual activity: Yes    Partners: Male  Lifestyle  . Physical activity:    Days per week: Not on file    Minutes per session: Not on file  . Stress: Not on file  Relationships  . Social connections:    Talks on phone: Not on file    Gets together: Not on file    Attends religious service: Not on file  Active member of club or organization: Not on file    Attends meetings of clubs or organizations: Not on file    Relationship status: Not on file  . Intimate partner violence:    Fear of current or ex partner: Not on file    Emotionally abused: Not on file    Physically abused: Not on file    Forced sexual activity: Not on file  Other Topics Concern  . Not on file  Social History Narrative  . Not on file    No Known Allergies  Outpatient Medications Prior to Visit  Medication Sig Dispense Refill  . sertraline (ZOLOFT) 100 MG tablet Take 1 tablet (100 mg total)  by mouth daily. 90 tablet 1  . hydrocortisone (ANUSOL-HC) 2.5 % rectal cream Place 1 application rectally 2 (two) times daily. 30 g 0  . hydrocortisone (ANUSOL-HC) 25 MG suppository Place 1 suppository (25 mg total) rectally 2 (two) times daily. 12 suppository 0   No facility-administered medications prior to visit.     Review of Systems  Constitutional: Negative for chills, fever, malaise/fatigue and weight loss.  HENT: Negative for ear discharge, ear pain and sore throat.   Eyes: Negative for blurred vision.  Respiratory: Negative for cough, sputum production, shortness of breath and wheezing.   Cardiovascular: Negative for chest pain, palpitations and leg swelling.  Gastrointestinal: Negative for abdominal pain, blood in stool, constipation, diarrhea, heartburn, melena, nausea and vomiting.  Genitourinary: Positive for frequency, hesitancy, pelvic pain, urgency and vaginal bleeding. Negative for dysuria, flank pain, hematuria and missed menses.  Musculoskeletal: Negative for back pain, joint pain, myalgias and neck pain.  Skin: Negative for rash.  Neurological: Negative for dizziness, tingling, sensory change, focal weakness and headaches.  Endo/Heme/Allergies: Negative for environmental allergies and polydipsia. Does not bruise/bleed easily.  Psychiatric/Behavioral: Negative for depression and suicidal ideas. The patient is not nervous/anxious and does not have insomnia.      Objective  Vitals:   01/11/18 1004  BP: 102/60  Pulse: 64  Weight: 144 lb (65.3 kg)  Height: 5\' 6"  (1.676 m)    Physical Exam  Constitutional: She is oriented to person, place, and time. She appears well-developed and well-nourished.  HENT:  Head: Normocephalic.  Right Ear: Hearing, tympanic membrane, external ear and ear canal normal.  Left Ear: Hearing, tympanic membrane, external ear and ear canal normal.  Nose: Nose normal.  Mouth/Throat: Uvula is midline and oropharynx is clear and moist. Mucous  membranes are pale.  Eyes: Pupils are equal, round, and reactive to light. Conjunctivae and EOM are normal. Lids are everted and swept, no foreign bodies found. Left eye exhibits no hordeolum. No foreign body present in the left eye. Right conjunctiva is not injected. Left conjunctiva is not injected. No scleral icterus.  Neck: Normal range of motion. Neck supple. No JVD present. No tracheal deviation present. No thyromegaly present.  Cardiovascular: Normal rate, regular rhythm, normal heart sounds and intact distal pulses. Exam reveals no gallop and no friction rub.  No murmur heard. Pulmonary/Chest: Effort normal and breath sounds normal. No respiratory distress. She has no wheezes. She has no rales.  Abdominal: Soft. Bowel sounds are normal. She exhibits no mass. There is no hepatosplenomegaly. There is tenderness in the suprapubic area. There is no rebound, no guarding and no CVA tenderness.  Musculoskeletal: Normal range of motion. She exhibits no edema or tenderness.  Lymphadenopathy:    She has no cervical adenopathy.  Neurological: She is alert and oriented to  person, place, and time. She has normal strength. She displays normal reflexes. No cranial nerve deficit.  Skin: Skin is warm. No rash noted.  Psychiatric: She has a normal mood and affect. Her mood appears not anxious. She does not exhibit a depressed mood.  Nursing note and vitals reviewed.     Assessment & Plan  Problem List Items Addressed This Visit    None    Visit Diagnoses    Cystitis    -  Primary   Urinalysis unable to read due to azo. Exam c/w cystitis. Will treat with nitrofurantoin 100 mg bid for 3 days.   Relevant Orders   POCT Urinalysis Dipstick (Completed)   Dysfunctional uterine bleeding       Patient with iud near maturation date. Check hgb baseline. Referral to gyn for eval and treatment. Patient to urgent/emergent if bleeding increses.   Relevant Orders   Ambulatory referral to Obstetrics / Gynecology       Meds ordered this encounter  Medications  . nitrofurantoin, macrocrystal-monohydrate, (MACROBID) 100 MG capsule    Sig: Take 1 capsule (100 mg total) by mouth 2 (two) times daily.    Dispense:  10 capsule    Refill:  0      Dr. Elizabeth Sauer [facility]  01/11/18

## 2018-01-15 ENCOUNTER — Encounter: Payer: Self-pay | Admitting: Obstetrics and Gynecology

## 2018-01-27 ENCOUNTER — Encounter: Payer: Self-pay | Admitting: Obstetrics and Gynecology

## 2018-02-04 ENCOUNTER — Ambulatory Visit (INDEPENDENT_AMBULATORY_CARE_PROVIDER_SITE_OTHER): Payer: No Typology Code available for payment source | Admitting: Obstetrics and Gynecology

## 2018-02-04 ENCOUNTER — Encounter

## 2018-02-04 ENCOUNTER — Other Ambulatory Visit (HOSPITAL_COMMUNITY)
Admission: RE | Admit: 2018-02-04 | Discharge: 2018-02-04 | Disposition: A | Discharge status: Home / Self Care | Payer: No Typology Code available for payment source | Source: Ambulatory Visit | Attending: Obstetrics and Gynecology | Admitting: Obstetrics and Gynecology

## 2018-02-04 ENCOUNTER — Other Ambulatory Visit: Payer: Self-pay

## 2018-02-04 ENCOUNTER — Encounter: Payer: Self-pay | Admitting: Obstetrics and Gynecology

## 2018-02-04 VITALS — BP 118/60 | HR 80 | Resp 16 | Ht 66.5 in | Wt 141.0 lb

## 2018-02-04 DIAGNOSIS — Z01419 Encounter for gynecological examination (general) (routine) without abnormal findings: Secondary | ICD-10-CM | POA: Diagnosis not present

## 2018-02-04 DIAGNOSIS — Z3009 Encounter for other general counseling and advice on contraception: Secondary | ICD-10-CM

## 2018-02-04 DIAGNOSIS — N898 Other specified noninflammatory disorders of vagina: Secondary | ICD-10-CM | POA: Diagnosis present

## 2018-02-04 DIAGNOSIS — N939 Abnormal uterine and vaginal bleeding, unspecified: Secondary | ICD-10-CM | POA: Diagnosis not present

## 2018-02-04 NOTE — Progress Notes (Signed)
52 y.o. [phone] Married Caucasian [place] female here for annual exam.    She was told she has adenomyosis in the past, and she uses Mirena for this.  She thinks it may be expired.   One month ago, she started to bleed for 15 days, heavy, and now spotting. Usually has spotting every 2 months.  This is irregular.  Some pelvic cramping with the spotting.   Some hot flashes 4 - 5 years ago, but not now.   Reporting vaginal discharge.   Urinary incontinence if her bladder is full.  Leaks urine with cough, laugh, sneeze, exercise.  Sometimes cannot get to the bathroom on time.  She has a cystocele.  Can control bowel movements but does need to strain.  Feels she has pelvic floor weakness.   3 vaginal deliveries.  No operative delivery.  Taking Zoloft to help with life transition.   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  Her husband is also a Marine scientist in [place].  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  Her husband is also working as an Tree surgeon her Probation officer. Children: 32 yo daughter, 53 yo daughter.  Both married and with children.  PCP: Elizabeth Sauer, MD    No LMP recorded. (Menstrual status: IUD).     Period Pattern: (!) Irregular     Sexually active: Yes.    The current method of family planning is IUD.   Mirena IUD placed 2014.  Exercising: No.     Smoker:  no  Health Maintenance: Pap: 2016 normal per patient in [place].  History of abnormal Pap:  no MMG: 07/2016 normal per patient--[facility]--Hx of cysts in Rt.Br. Colonoscopy:  Age 54 normal BMD:   n/a  Result  n/a TDaP:  10-30-16 Gardasil:   no AVW:[id number] Hep C:unsure Screening Labs:   -- Flu vaccine last week.   reports that she has never smoked. She has never used smokeless tobacco. She reports that she does not drink alcohol or use drugs.  Past Medical History:  Diagnosis Date  . Abnormal uterine bleeding   . Hypercholesterolemia   . Urinary incontinence      Past Surgical History:  Procedure Laterality Date  . APPENDECTOMY  1996    Current Outpatient Medications  Medication Sig Dispense Refill  . levonorgestrel (MIRENA) 20 MCG/24HR IUD 1 each by Intrauterine route once.    . sertraline (ZOLOFT) 100 MG tablet Take 1 tablet (100 mg total) by mouth daily. 90 tablet 1   No current facility-administered medications for this visit.     Family History  Problem Relation Age of Onset  . Diabetes Mother   . Cancer Paternal Aunt        cervical  . Cancer Maternal Aunt        colon cancer  . Lung cancer Maternal Grandfather     Review of Systems  Constitutional: Negative.   HENT: Negative.   Eyes: Negative.   Respiratory: Negative.   Cardiovascular: Negative.   Gastrointestinal: Negative.   Endocrine: Negative.   Genitourinary: Negative.   Musculoskeletal: Negative.   Skin: Negative.   Allergic/Immunologic: Negative.   Neurological: Negative.   Hematological: Negative.   Psychiatric/Behavioral: Negative.     Exam:   BP 118/60 (BP Location: Right Arm, Patient Position: Sitting, Cuff Size: Normal)   Pulse 80   Resp 16   Ht 5' 6.5" (1.689 m)   Wt 141 lb (64 kg)   BMI 22.42 kg/m     General  appearance: alert, cooperative and appears stated age Head: Normocephalic, without obvious abnormality, atraumatic Neck: no adenopathy, supple, symmetrical, trachea midline and thyroid normal to inspection and palpation Lungs: clear to auscultation bilaterally Breasts: normal appearance, no masses or tenderness, No nipple retraction or dimpling, No nipple discharge or bleeding, No axillary or supraclavicular adenopathy Heart: regular rate and rhythm Abdomen: soft, non-tender; no masses, no organomegaly Extremities: extremities normal, atraumatic, no cyanosis or edema Skin: Skin color, texture, turgor normal. No rashes or lesions Lymph nodes: Cervical, supraclavicular, and axillary nodes normal. No abnormal inguinal nodes  palpated Neurologic: Grossly normal  Pelvic: External genitalia:  no lesions              Urethra:  normal appearing urethra with no masses, tenderness or lesions              Bartholins and Skenes: normal                 Vagina: normal appearing vagina with normal color and discharge, no lesions              Cervix: no lesions.  IUD strings noted.               Pap taken: Yes.   Bimanual Exam:  Uterus:  normal size, contour, position, consistency, mobility, non-tender              Adnexa: no mass, fullness, tenderness              Rectal exam: Yes.  .  Confirms.              Anus:  normal sphincter tone, no lesions  Chaperone was present for exam.  Assessment:   Well woman visit with normal exam. Mirena IUD patient.  May be expired.  Adenomyosis by history.  Urinary incontinence and cystocele by history.  No cystocele on exam today. Vaginitis.  Hyperlipidemia.  Life transition.  On Zoloft.   Plan: Mammogram screening. Recommended self breast awareness. Pap and HR HPV as above. Vaginitis testing from pap.  Guidelines for Calcium, Vitamin D, regular exercise program including cardiovascular and weight bearing exercise. FSH and estradiol today. Return for fasting labs.  Return for pelvic US and IUD removal.  Follow up annually and prn.   After visit summary provided.

## 2018-02-04 NOTE — Patient Instructions (Signed)

## 2018-02-05 ENCOUNTER — Telehealth: Payer: Self-pay | Admitting: Obstetrics and Gynecology

## 2018-02-05 LAB — CYTOLOGY - PAP
Bacterial vaginitis: NEGATIVE
CANDIDA VAGINITIS: NEGATIVE
Diagnosis: NEGATIVE
HPV (WINDOPATH): NOT DETECTED
TRICH (WINDOWPATH): NEGATIVE

## 2018-02-05 LAB — ESTRADIOL: Estradiol: <5 pg/mL

## 2018-02-05 LAB — FOLLICLE STIMULATING HORMONE: FSH: 58.5 m[IU]/mL

## 2018-02-05 NOTE — Telephone Encounter (Signed)
Call placed to convey benefits. 

## 2018-02-08 ENCOUNTER — Other Ambulatory Visit: Payer: Self-pay | Admitting: Obstetrics and Gynecology

## 2018-02-08 DIAGNOSIS — N950 Postmenopausal bleeding: Secondary | ICD-10-CM

## 2018-02-08 NOTE — Telephone Encounter (Signed)
-----   Message from Patton Salles, MD sent at 02/08/2018 11:13 AM EDT ----- Please contact with results.  Her blood work indicates she is postmenopausal.  She had an episode of vaginal bleeding one month ago that lasted for 15 days.  She has a Mirena IUD that she thinks has expired.  I have placed an order for a pelvic US and IUD removal. She also likely needs an endometrial biopsy.  I am placing an order for this as well.   Her pap is normal and her HR HPV is negative.  Vaginitis testing is negative.  Pap recall - 02.

## 2018-02-08 NOTE — Telephone Encounter (Signed)
Notes recorded by Leda Min, RN on 02/08/2018 at 12:38 PM EDT Spoke with patient, advised as seen below per Dr. Edward Jolly. Advised patient to take Motrin 800 mg with food and water one hour before procedure. PUS/IUD removal/EMB is scheduled for 02/25/18 at 11am, consult at 11:30am with Dr. Edward Jolly. Patient verbalizes understanding and is agreeable to plan.   02 recall placed ------   Routing to [employer].   Encounter closed.

## 2018-02-08 NOTE — Telephone Encounter (Signed)
Dr. Edward Jolly -please review labs dated 02/04/18 and advise.

## 2018-02-08 NOTE — Telephone Encounter (Signed)
Spoke with patient regarding benefit for ultrasound and IUD removal. Patient understood and agreeable. Patient ready to schedule. Patient scheduled 02/25/18 with Dr Edward Jolly. Patient aware of appointment date, arrival time and cancellation policy. . No further questions regarding benefits.   Patient would like to know if lab results are available from appointment last week. Advised patient I will forward to our Triage Nurse to review. Patient is agreeable to a return call.  Forwarding to Triage Nurse.

## 2018-02-08 NOTE — Telephone Encounter (Signed)
Please see separate triage message sent through this am.  She is menopausal by her [facility] and estradiol level.  I am recommending an EMB at the time of her ultrasound and IUD removal. Her vaginitis testing is negative.

## 2018-02-12 ENCOUNTER — Telehealth: Payer: Self-pay | Admitting: Obstetrics and Gynecology

## 2018-02-12 NOTE — Telephone Encounter (Addendum)
Patient called to see if she could reschedule her ultrasound to later in the afternoon the same day, 02/25/18. She did not want to cancel the existing appointment.  Suzy/Rosa

## 2018-02-12 NOTE — Telephone Encounter (Signed)
Return call to patient. Available appointments did not meet patient requests. Patient chooses to keep appointment as scheduled.  Encounter closed.

## 2018-02-25 ENCOUNTER — Encounter: Payer: Self-pay | Admitting: Obstetrics and Gynecology

## 2018-02-25 ENCOUNTER — Ambulatory Visit (INDEPENDENT_AMBULATORY_CARE_PROVIDER_SITE_OTHER): Payer: No Typology Code available for payment source

## 2018-02-25 ENCOUNTER — Ambulatory Visit: Payer: No Typology Code available for payment source | Admitting: Obstetrics and Gynecology

## 2018-02-25 ENCOUNTER — Other Ambulatory Visit: Payer: Self-pay

## 2018-02-25 VITALS — BP 100/64 | HR 64 | Ht 66.5 in | Wt 145.0 lb

## 2018-02-25 DIAGNOSIS — N939 Abnormal uterine and vaginal bleeding, unspecified: Secondary | ICD-10-CM

## 2018-02-25 DIAGNOSIS — Z3009 Encounter for other general counseling and advice on contraception: Secondary | ICD-10-CM

## 2018-02-25 DIAGNOSIS — N950 Postmenopausal bleeding: Secondary | ICD-10-CM | POA: Diagnosis not present

## 2018-02-25 NOTE — Progress Notes (Signed)
GYNECOLOGY  VISIT   HPI: 52 y.o.   Married  Caucasian/Turkish  female   [phone] with No LMP recorded. (Menstrual status: IUD).   here for pelvic ultrasound, IUD removal and possible EMB.    Has an expired Mirena IUD which was placed for treatment of adenomyosis.  Thinks is expiring soon.   Had bleeding for 15 days in September.  Usually has spotting every 2 months.   FSH 58.5 and E2 < 5 on 02/04/18.   Had pelvic US 2 months ago and noted a right ovarian follicle about 2 cm and endometrium measuring 18 mm at that time.  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  States she does have vaginal discharge every day.  Shows me her underwear which looks like brown tan drainage.  Had negative vaginitis testing.   Concerned about having pregnancy prevention.   GYNECOLOGIC HISTORY: No LMP recorded. (Menstrual status: IUD). Contraception: ?Mirena IUD, possibly expired Menopausal hormone therapy:  none Last mammogram: 07/2016 normal per patient--[facility]--Hx of cysts in Rt.Br. Last pap smear: 02-04-18 Neg:Neg HR HPV        OB History    Gravida  4   Para  4   Term      Preterm  2   AB      Living  3     SAB      TAB      Ectopic      Multiple      Live Births  3              Patient Active Problem List   Diagnosis Date Noted  . Adhesive capsulitis of left shoulder 11/02/2017  . Anal or rectal pain 11/02/2017  . Irritable bowel syndrome 11/02/2017  . History of BCG vaccination 10/23/2017  . Depression 07/01/2017  . Asthma 07/01/2017  . Left thyroid nodule 01/30/2017  . Prediabetes 08/03/2016    Past Medical History:  Diagnosis Date  . Abnormal uterine bleeding   . Hypercholesterolemia   . Urinary incontinence     Past Surgical History:  Procedure Laterality Date  . APPENDECTOMY  1996    Current Outpatient Medications  Medication Sig Dispense Refill  . levonorgestrel (MIRENA) 20 MCG/24HR IUD 1 each by Intrauterine route once.    . sertraline  (ZOLOFT) 100 MG tablet Take 1 tablet (100 mg total) by mouth daily. 90 tablet 1   No current facility-administered medications for this visit.      ALLERGIES: Patient has no known allergies.  Family History  Problem Relation Age of Onset  . Diabetes Mother   . Cancer Paternal Aunt        cervical  . Cancer Maternal Aunt        colon cancer  . Lung cancer Maternal Grandfather     Social History   Socioeconomic History  . Marital status: Married    Spouse name: Not on file  . Number of children: Not on file  . Years of education: Not on file  . Highest education level: Not on file  Occupational History  . Not on file  Social Needs  . Financial resource strain: Not on file  . Food insecurity:    Worry: Not on file    Inability: Not on file  . Transportation needs:    Medical: Not on file    Non-medical: Not on file  Tobacco Use  . Smoking status: Never Smoker  . Smokeless tobacco: Never Used  Substance  and Sexual Activity  . Alcohol use: No    Frequency: Never  . Drug use: No  . Sexual activity: Yes    Partners: Male    Birth control/protection: IUD    Comment: Expired Mirena IUD--place 2014  Lifestyle  . Physical activity:    Days per week: Not on file    Minutes per session: Not on file  . Stress: Not on file  Relationships  . Social connections:    Talks on phone: Not on file    Gets together: Not on file    Attends religious service: Not on file    Active member of club or organization: Not on file    Attends meetings of clubs or organizations: Not on file    Relationship status: Not on file  . Intimate partner violence:    Fear of current or ex partner: Not on file    Emotionally abused: Not on file    Physically abused: Not on file    Forced sexual activity: Not on file  Other Topics Concern  . Not on file  Social History Narrative  . Not on file    Review of Systems  All other systems reviewed and are negative.   PHYSICAL EXAMINATION:     BP 100/64 (BP Location: Right Arm, Patient Position: Sitting, Cuff Size: Normal)   Pulse 64   Ht 5' 6.5" (1.689 m)   Wt 145 lb (65.8 kg)   BMI 23.05 kg/m     General appearance: alert, cooperative and appears stated age    Pelvic: External genitalia:  no lesions              Urethra:  normal appearing urethra with no masses, tenderness or lesions              Bartholins and Skenes: normal                 Vagina: normal appearing vagina with normal color and discharge, no lesions              Cervix: no lesions.  IUD strings noted.                 Bimanual Exam:  Uterus:  normal size, contour, position, consistency, mobility, non-tender              Adnexa: no mass, fullness, tenderness          EMB: Consent for procedure. Sterile prep with Hibiclens. Minipiipelle passed to    6.5      cm twice.   Tissue to pathology.  Minimal EBL. No complications.   Chaperone was present for exam.  ASSESSMENT  Uterine bleeding with Mirena IUD.  Postmenopausal by recent labs but signs of ovarian function with follicle present today.    PLAN  We discussed etiologies for bleeding including expiring IUD and resumption of menstruation, STDs, chronic endometritis, endometrial polyp, hyperplasia.  FU EMB.  Instructions precautions given. We did discuss extended Mirena IUD use for one year beyond expiration date if appropriate.    An After Visit Summary was printed and given to the patient.  __15____ minutes face to face time of which over 50% was spent in counseling.

## 2018-02-25 NOTE — Progress Notes (Signed)
Encounter reviewed by Dr. Gevin Perea Amundson C. Silva.  

## 2018-02-25 NOTE — Patient Instructions (Signed)

## 2018-03-05 ENCOUNTER — Telehealth: Payer: Self-pay | Admitting: Emergency Medicine

## 2018-03-05 NOTE — Telephone Encounter (Signed)
-----   Message from Patton Salles, MD sent at 02/28/2018  7:14 PM EST ----- Please report results of endometrial biopsy which shows benign secretory endometrium.  It appears that the patient may be perimenopausal and not postmenopausal. She may have one year extended use of her Mirena IUD, beyond the expiration date.  I do not have the insertion date available to me, so she will need to check her insertion date.   Please have her return if she has any ongoing bleeding concerns.

## 2018-03-08 NOTE — Telephone Encounter (Signed)
1203: Spoke with patient.  Vaginal bleeding started yesterday. Changing pad q 4 hours. No dizziness or weakness. No fevers, no vaginal odor. States "its not  heavy bleeding", red bleeding, no clots.  Reviewed message from Dr. Edward Jolly and result note.  Patient states she had the IUD placed in [place] at a Private hospital. She will call today or tomorrow to see if she can find out when she had it placed, so she will know if she needs to have it removed/or is able to keep in for one more year.

## 2018-03-08 NOTE — Telephone Encounter (Signed)
Patient is calling to report that she has been bleeding since yesterday.

## 2018-03-09 NOTE — Telephone Encounter (Signed)
Patient is asking to talk with Dr.Silva's nurse. Patient also states that she had her IUD inserted in March 2015.

## 2018-03-09 NOTE — Telephone Encounter (Signed)
Spoke with patient. She states she continues to have some vaginal bleeding. Still "not heavy". She expresses concerns about vaginal bleeding. Reviewed Dr. Rica RecordsSilva's notes from visit and negative endometrial biopsy.  Perimenopausal bleeding and mirena close to expiration date.   Patient confirms IUD was inserted 06/2013.   With patient continuing c/o vaginal bleeding, needs OV now or should she calendar bleeding and monitor?

## 2018-03-10 ENCOUNTER — Other Ambulatory Visit: Payer: Self-pay | Admitting: Obstetrics and Gynecology

## 2018-03-10 DIAGNOSIS — Z0000 Encounter for general adult medical examination without abnormal findings: Secondary | ICD-10-CM

## 2018-03-10 NOTE — Telephone Encounter (Signed)
Patient called requesting to speak with Autumn Brown. She said she is having pain and cramping that has increased yesterday and today. She said she would like something to help with the pain.  Last seen: 02/25/18

## 2018-03-10 NOTE — Telephone Encounter (Signed)
Spoke with patient, advised as seen below per Dr. Edward JollySilva.   Patient reports bleeding started 03/07/18, describes flow as "slow, not heavy". Patient reports abdominal/pelvic cramping with bleeding, 4/10 on pain scale. Taking meloxicam for pain, no relief. Patient requesting medication for cramping.   Patient denies N/V, fever/chills or lower back pain or urinary symptoms.   OV scheduled for 11/21 at 1:15pm with Dr. Edward JollySilva. Patient declined OV for 11/15 due to work schedule. ER precautions provided for severe pain or new/worsening symptoms.   Routing to provider for final review. Patient is agreeable to disposition. Will close encounter.

## 2018-03-10 NOTE — Telephone Encounter (Signed)
Some women do start to have menstrual cycles as they approach the expiration date of the IUD.  This is not unusual.  Her endometrial biopsy was benign.  Her ultrasound did suggest adenomyosis.   I recommend she return to have her blood work done and I will add a TSH.  Thyroid a normalities can contribute to abnormal uterine bleeding.

## 2018-03-16 ENCOUNTER — Telehealth: Payer: Self-pay | Admitting: Obstetrics and Gynecology

## 2018-03-16 NOTE — Telephone Encounter (Signed)
Patient cancelled office visit for AUB with IUD and labs scheduled for 03/18/18. Patient to call back to reschedule.

## 2018-03-18 ENCOUNTER — Ambulatory Visit: Payer: No Typology Code available for payment source | Admitting: Obstetrics and Gynecology

## 2018-03-18 NOTE — Telephone Encounter (Signed)
Thank you for the update.  Ok to close encounter. 

## 2018-04-01 ENCOUNTER — Telehealth: Payer: Self-pay | Admitting: Obstetrics and Gynecology

## 2018-04-01 ENCOUNTER — Encounter: Payer: Self-pay | Admitting: Obstetrics and Gynecology

## 2018-04-01 DIAGNOSIS — R32 Unspecified urinary incontinence: Secondary | ICD-10-CM

## 2018-04-01 DIAGNOSIS — N8189 Other female genital prolapse: Secondary | ICD-10-CM

## 2018-04-01 NOTE — Telephone Encounter (Signed)
Routing to Dr. Edward JollySilva to advise on Pelvic PT referral.

## 2018-04-01 NOTE — Telephone Encounter (Signed)
Patient sent the following message through MyChart. Routing to triage to assist patient with request.   Hello,  During my visit, we talked about my urinary incontinence and hemorrhoids (pelvic muscle dysfunction) and you suggested me physical therapy. I would like have this therapy and I need referral. Could you give me a referral for incontinence, probable rectal prolapses and pelvic muscle dysfunction.  Thanks in advance   Huntsman CorporationYasemin Brown

## 2018-04-02 ENCOUNTER — Other Ambulatory Visit: Payer: Self-pay | Admitting: Obstetrics and Gynecology

## 2018-04-02 NOTE — Telephone Encounter (Signed)
Ok for referral to Eulis Fosterheryl Gray with [facility] for urinary incontinence and pelvic floor relaxation.

## 2018-04-02 NOTE — Telephone Encounter (Signed)
Ambulatory referral order placed.   Patient notified via MyChart message.   Routing to [employer] Davis.   Encounter closed.

## 2018-04-15 ENCOUNTER — Ambulatory Visit
Payer: No Typology Code available for payment source | Attending: Obstetrics and Gynecology | Admitting: Physical Therapy

## 2018-04-15 ENCOUNTER — Other Ambulatory Visit: Payer: Self-pay

## 2018-04-15 DIAGNOSIS — M6281 Muscle weakness (generalized): Secondary | ICD-10-CM | POA: Diagnosis not present

## 2018-04-15 DIAGNOSIS — R293 Abnormal posture: Secondary | ICD-10-CM | POA: Diagnosis present

## 2018-04-16 ENCOUNTER — Encounter: Payer: Self-pay | Admitting: Physical Therapy

## 2018-04-16 NOTE — Therapy (Addendum)
[facility] [address] [place], [place], [address] Phone: [phone]   Fax:  [phone]  Physical Therapy Evaluation  Patient Details  Name: Autumn Brown MRN: [id number] Date of Birth: [birth year] Referring Provider (PT): Yisroel Ramming, Everardo All   Encounter Date: 04/15/2018  PT End of Session - 04/16/18 1043    Visit Number  1    Date for PT Re-Evaluation  07/08/18    PT Start Time  4970    PT Stop Time  1542    PT Time Calculation (min)  55 min    Activity Tolerance  Patient tolerated treatment well    Behavior During Therapy  [facility] for tasks assessed/performed       Past Medical History:  Diagnosis Date  . Abnormal uterine bleeding   . Hypercholesterolemia   . Urinary incontinence     Past Surgical History:  Procedure Laterality Date  . APPENDECTOMY  1996    There were no vitals filed for this visit.   Subjective Assessment - 04/15/18 1454    Subjective  Pt states urinary leakage and when having BM always has to push something back in.  She report she has hemerrhoids.  She states she has ligament laxity and chronic back pain.  Today states she had worse low back today.      Pertinent History  has been told of cystocele, chronic pain    Patient Stated Goals  strengthen pelvic floor and reduce back pain    Currently in Pain?  Yes    Pain Score  8     Pain Location  Back    Pain Orientation  Mid    Pain Descriptors / Indicators  Sharp    Pain Type  Chronic pain    Pain Radiating Towards  throughout spine up to shoulders    Aggravating Factors   standing longer    Pain Relieving Factors  heat         [facility] PT Assessment - 04/16/18 0001      Assessment   Medical Diagnosis  R32 (ICD-10-CM) - Urinary incontinence, unspecified type; N81.89 (ICD-10-CM) - Pelvic floor relaxation    Referring Provider (PT)  AMUNDSON C SILVA, BROOK E    Onset Date/Surgical Date  --   long time ago   Prior  Therapy  No      Precautions   Precautions  None      Restrictions   Weight Bearing Restrictions  No      Home Environment   Living Environment  Private residence    Living Arrangements  Spouse/significant other;Children   2 daughters     Prior Function   Level of Independence  Independent    Vocation  Full time employment    Vocation Requirements  sonographer      Cognition   Overall Cognitive Status  Within Functional Limits for tasks assessed      Posture/Postural Control   Posture/Postural Control  Postural limitations    Posture Comments  leaning left due to scoliosis      AROM   Overall AROM Comments  lumbar flexion and ext 50% limited      PROM   Overall PROM Comments  hip ER 30% limited      Strength   Overall Strength Comments  core strength 4/5; 4-/5 MMT of Rt ER add, ext; Lt ext      Special Tests    Special Tests  Lumbar  Lumbar Tests  Straight Leg Raise      Straight Leg Raise   Findings  Negative      Ambulation/Gait   Gait Pattern  Within Functional Limits                Objective measurements completed on examination: See above findings.    Pelvic Floor Special Questions - 04/16/18 0001    Prior Pelvic/Prostate Exam  Yes    Number of Pregnancies  3    Number of Vaginal Deliveries  3    Episiotomy Performed  Yes   3   Currently Sexually Active  Yes    Is this Painful  No    Urinary Leakage  Yes    Pad use  No, but keeping extra underwear    Activities that cause leaking  With strong urge;Coughing;Sneezing    Urinary urgency  Yes    Urinary frequency  every half hour    Fecal incontinence  No    Skin Integrity  Intact;Hemorroids    Prolapse  Anterior Wall;Posterior Wall;Uterine    Pelvic Floor Internal Exam  pt identity confirmed and pt gives infomred consent to treat and assess pelvic floor sof tissue    Exam Type  Vaginal    Strength  weak squeeze, no lift    Strength # of reps  2    Strength # of seconds  3    Tone  low        [facility] Adult PT Treatment/Exercise - 04/16/18 0001      Self-Care   Self-Care  Other Self-Care Comments    Other Self-Care Comments   initial HEP             PT Education - 04/16/18 1049    Education Details   Access Code: [email or web address]     Person(s) Educated  Patient    Methods  Explanation;Demonstration;Verbal cues;Handout    Comprehension  Verbalized understanding;Returned demonstration       PT Short Term Goals - 04/16/18 1058      PT SHORT TERM GOAL #1   Title  ind with basic kegel exercises and able to lift and not bulge pelvic floor when performing    Time  4    Period  Weeks    Status  New    Target Date  05/14/18      PT SHORT TERM GOAL #2   Title  pt will report 25% less low back pain for improved functional activities    Time  4    Period  Weeks    Status  New    Target Date  05/14/18        PT Long Term Goals - 04/16/18 1054      PT LONG TERM GOAL #1   Title  pt will demosntrate 3/5 MMT of pelvic floor strength for improved bladder control and lumbopelvic stability    Time  12    Period  Weeks    Status  New    Target Date  07/08/18      PT LONG TERM GOAL #2   Title  pt will report 50% less leakage when coughing or sneezing due to improved strength to perform knack technique.    Time  12    Period  Weeks    Status  New    Target Date  07/08/18      PT LONG TERM GOAL #3   Title  pt will report 50% reduction in low  back pain due to improved core strength and posture    Time  12    Period  Weeks    Status  New    Target Date  07/08/18      PT LONG TERM GOAL #4   Title  Pt will be ind with advanced HEP to porgress strength on her own and continue with bladder control    Time  12    Period  Weeks    Status  New    Target Date  07/08/18             Plan - 04/16/18 1211    Clinical Impression Statement  Pt presents to skilled PT with chief complaint of urinary leakage that has been going on for years and has gotten worse.  Pt  also has chronic low back pain that is worsening as well.  Pt demosntrates abnormal posture due to scoliosis of the spine.  She has limited lumbar AROM.  Pt has decreased LE strength and core strength as mentioned above.  Pt has noticeable rectocele and cystocele that increases with bulging.  She is able to elevate slightly with kegel . Pt reports possible rectal prolapse as well but was not observed.  Hemorrhoids present externally. Pt has pelvic floor strength of 1-2/5 MMT and low endurance of 3 seconds.  Pt will benefit from skilled PT to address impairment to reduce risk of further prolapse and improve quality of life during functional activiities.    History and Personal Factors relevant to plan of care:  3 vaginal deliveries, chronic low back pain, ligament laxity    Clinical Presentation  Evolving    Clinical Presentation due to:  pt is having worsening symptoms    Clinical Decision Making  Moderate    Rehab Potential  Excellent    Clinical Impairments Affecting Rehab Potential  scoliosis, chronic pain    PT Frequency  1x / week    PT Duration  12 weeks    PT Treatment/Interventions  Taping;Dry needling;Manual techniques;Scar mobilization;Patient/family education;Therapeutic exercise;Neuromuscular re-education;Therapeutic activities;ADLs/Self Care Home Management;Cryotherapy;Biofeedback;Electrical Stimulation;Moist Heat    PT Next Visit Plan  circular contraction tactile cues and biofeedback;     PT Home Exercise Plan  Access Code: [email or web address]     Recommended Other Services  eval 04/15/18    Consulted and Agree with Plan of Care  Patient       Patient will benefit from skilled therapeutic intervention in order to improve the following deficits and impairments:  Decreased strength, Increased fascial restricitons, Pain, Decreased range of motion, Postural dysfunction, Impaired tone, Decreased coordination  Visit Diagnosis: Muscle weakness (generalized)  Abnormal posture     Problem  List Patient Active Problem List   Diagnosis Date Noted  . Adhesive capsulitis of left shoulder 11/02/2017  . Anal or rectal pain 11/02/2017  . Irritable bowel syndrome 11/02/2017  . History of BCG vaccination 10/23/2017  . Depression 07/01/2017  . Asthma 07/01/2017  . Left thyroid nodule 01/30/2017  . Prediabetes 08/03/2016    Zannie Cove, PT 04/16/2018, 12:55 PM  [facility] [address] [place], [place], [address] Phone: [phone]   Fax:  [phone]  Name: Autumn Brown MRN: [id number] Date of Birth: 07-30-1965  PHYSICAL THERAPY DISCHARGE SUMMARY  Visits from Start of Care: 1  Current functional level related to goals / functional outcomes: See above goals, eval only   Remaining deficits: eval only   Education / Equipment: HEP  Plan: Patient  agrees to discharge.  Patient goals were not met. Patient is being discharged due to not returning since the last visit.  ?????    Google, PT 06/23/18 10:49 AM

## 2018-04-16 NOTE — Patient Instructions (Signed)
Access Code: [email or web address]  [email or web address]: [email or web address]  Date: 04/16/2018  Prepared by: Dorie RankJacqueline Crosser   Exercises  Ball squeeze with Kegel - 10 reps - 1 sets - 3 sec hold - 2x daily - 7x weekly  Supine Double Knee to Chest - 10 reps - 1 sets - 1x daily - 7x weekly

## 2018-04-28 DIAGNOSIS — E7800 Pure hypercholesterolemia, unspecified: Secondary | ICD-10-CM

## 2018-04-28 HISTORY — DX: Pure hypercholesterolemia, unspecified: E78.00

## 2018-05-10 ENCOUNTER — Encounter: Payer: Self-pay | Admitting: Obstetrics and Gynecology

## 2018-05-10 ENCOUNTER — Telehealth: Payer: Self-pay | Admitting: Obstetrics and Gynecology

## 2018-05-11 ENCOUNTER — Ambulatory Visit (INDEPENDENT_AMBULATORY_CARE_PROVIDER_SITE_OTHER): Payer: No Typology Code available for payment source | Admitting: Certified Nurse Midwife

## 2018-05-11 ENCOUNTER — Ambulatory Visit: Payer: No Typology Code available for payment source | Admitting: Certified Nurse Midwife

## 2018-05-11 ENCOUNTER — Encounter: Payer: Self-pay | Admitting: Certified Nurse Midwife

## 2018-05-11 VITALS — BP 108/63 | HR 66 | Resp 14 | Ht 66.0 in | Wt 140.8 lb

## 2018-05-11 DIAGNOSIS — N3001 Acute cystitis with hematuria: Secondary | ICD-10-CM

## 2018-05-11 DIAGNOSIS — N898 Other specified noninflammatory disorders of vagina: Secondary | ICD-10-CM | POA: Diagnosis not present

## 2018-05-11 DIAGNOSIS — Z01419 Encounter for gynecological examination (general) (routine) without abnormal findings: Secondary | ICD-10-CM

## 2018-05-11 DIAGNOSIS — R350 Frequency of micturition: Secondary | ICD-10-CM | POA: Diagnosis not present

## 2018-05-11 DIAGNOSIS — B3731 Acute candidiasis of vulva and vagina: Secondary | ICD-10-CM

## 2018-05-11 DIAGNOSIS — Z0000 Encounter for general adult medical examination without abnormal findings: Secondary | ICD-10-CM

## 2018-05-11 DIAGNOSIS — B373 Candidiasis of vulva and vagina: Secondary | ICD-10-CM

## 2018-05-11 DIAGNOSIS — E559 Vitamin D deficiency, unspecified: Secondary | ICD-10-CM | POA: Diagnosis not present

## 2018-05-11 LAB — POCT URINALYSIS DIPSTICK
Bilirubin, UA: NEGATIVE
Glucose, UA: NEGATIVE
Ketones, UA: NEGATIVE
PROTEIN UA: POSITIVE — AB
Urobilinogen, UA: NEGATIVE E.U./dL — AB
pH, UA: 5 (ref 5.0–8.0)

## 2018-05-11 MED ORDER — NITROFURANTOIN MONOHYD MACRO 100 MG PO CAPS: ORAL_CAPSULE | ORAL | 0 refills | Status: DC

## 2018-05-11 MED ORDER — NYSTATIN 100000 UNIT/GM EX OINT: TOPICAL_OINTMENT | CUTANEOUS | 0 refills | Status: DC

## 2018-05-11 MED FILL — NITROFURANTOIN MONO-MCR 100: 100 | 7 days supply | Qty: 14 | Fill #0

## 2018-05-11 MED FILL — NYSTATIN 100,000 UNITS/GM O: 100000 | 7 days supply | Qty: 30 | Fill #0

## 2018-05-11 NOTE — Progress Notes (Signed)
53 y.o. Married Caucasian/ Turkish female [phone] here with complaint of vaginal symptoms of itching in vaginal and rectal area for 3  Weeks. Describes discharge as clear/ yellow. Also have urinary Onset of symptoms about  21 days ago. Denies new personal products or vaginal dryness. No STD concerns. Patient is also having urinary symptoms of pain with urination, frequency and urgency for last 24 hours. Contraception is Mirena IUD. Patient is drinking adequate water at times. Denies fever, chills or back pain. History of kidney stones in past, but none of the symptoms at this time. No other health issues today.  Review of Systems  Constitutional: Negative for chills, fever and malaise/fatigue.  HENT: Negative.   Eyes: Negative.   Respiratory: Negative.   Cardiovascular: Negative.   Gastrointestinal: Negative for abdominal pain and constipation.  Genitourinary: Positive for frequency and urgency.       Unscheduled bleeding or spotting  Vulvar/ vaginal itching    Musculoskeletal: Negative.   Skin: Negative.   Neurological: Negative.   Endo/Heme/Allergies: Negative.   Psychiatric/Behavioral: Negative.      Past Medical History:  Diagnosis Date  . Abnormal uterine bleeding   . Hypercholesterolemia   . Urinary incontinence     Past Surgical History:  Procedure Laterality Date  . APPENDECTOMY  1996     Current Outpatient Medications:  .  levonorgestrel (MIRENA) 20 MCG/24HR IUD, 1 each by Intrauterine route once., Disp: , Rfl:  .  sertraline (ZOLOFT) 100 MG tablet, Take 1 tablet (100 mg total) by mouth daily., Disp: 90 tablet, Rfl: 1  ALLERGIES: Patient has no known allergies.  O:  Healthy female WDWN Affect: normal, orientation x 3  Physical Exam:  General appearance: alert, cooperative and appears stated age Skin : warm and dry Abdomen:Soft, not tender, no masses, positive for suprapubic pain CVAT: bilateral negative  Inguinal Lymph nodes: no enlargement or  tenderness Pelvic exam: External genital: normal female, no lesions, shaved, with increase pink in vulva area and around rectal area, with slight scaling and exudate, wet prep taken BUS: negative Bladder, urethra, urethral meatus tender Vagina: white discharge noted in posterior fornix, no odor.  Affirm taken. Cervix: normal, non tender, no CMT Uterus: normal, non tender Adnexa:normal, non tender, no masses or fullness noted Rectal area: slight increase pink and scaling noted    A: Normal pelvic exam  Vulvitis  UTI  History of kidney stones  Future labs from aex today also( see lab orders)   P: Discussed findings of vulvitis and etiology. Discussed Aveeno or baking soda sitz bath for comfort. Discussed can be related to new personal products or pad use for long periods. Patient does use these, but changes frequently for discharge. Discussed external treatment to help with symptoms. Agreeable. Change to [facility] bar soap to reduce dryness.  Rx: Nystatin ointment see order with instructions  Lab: affirm Discussed symptoms consistent with UTI and need to treat. Patient agrees with this. Warning signs of UTI given and need to advise if occurs. Increase water intake with medication use. If symptoms no resolving needs to advise.  Rx Macrobid see order with instructions  Lab: Urine micro and culture    Rv prn

## 2018-05-12 LAB — URINALYSIS, MICROSCOPIC ONLY: Casts: NONE SEEN /lpf

## 2018-05-12 LAB — CBC
HEMOGLOBIN: 12.6 g/dL (ref 11.1–15.9)
Hematocrit: 37.7 % (ref 34.0–46.6)
MCH: 29.2 pg (ref 26.6–33.0)
MCHC: 33.4 g/dL (ref 31.5–35.7)
MCV: 87 fL (ref 79–97)
Platelets: 171 10*3/uL (ref 150–450)
RBC: 4.32 x10E6/uL (ref 3.77–5.28)
RDW: 13.1 % (ref 11.7–15.4)
WBC: 6.4 10*3/uL (ref 3.4–10.8)

## 2018-05-12 LAB — VAGINITIS/VAGINOSIS, DNA PROBE
Candida Species: NEGATIVE
Gardnerella vaginalis: NEGATIVE
TRICHOMONAS VAG: NEGATIVE

## 2018-05-12 LAB — COMPREHENSIVE METABOLIC PANEL
ALT: 17 IU/L (ref 0–32)
AST: 14 IU/L (ref 0–40)
Albumin/Globulin Ratio: 2.3 — ABNORMAL HIGH (ref 1.2–2.2)
Albumin: 4.8 g/dL (ref 3.5–5.5)
Alkaline Phosphatase: 55 IU/L (ref 39–117)
BUN/Creatinine Ratio: 23 (ref 9–23)
BUN: 13 mg/dL (ref 6–24)
Bilirubin Total: 0.3 mg/dL (ref 0.0–1.2)
CALCIUM: 9.2 mg/dL (ref 8.7–10.2)
CO2: 23 mmol/L (ref 20–29)
Chloride: 104 mmol/L (ref 96–106)
Creatinine, Ser: 0.56 mg/dL — ABNORMAL LOW (ref 0.57–1.00)
GFR calc Af Amer: 123 mL/min/{1.73_m2} (ref >59)
GFR calc non Af Amer: 107 mL/min/{1.73_m2} (ref >59)
Globulin, Total: 2.1 g/dL (ref 1.5–4.5)
Glucose: 93 mg/dL (ref 65–99)
Potassium: 3.8 mmol/L (ref 3.5–5.2)
Sodium: 142 mmol/L (ref 134–144)
Total Protein: 6.9 g/dL (ref 6.0–8.5)

## 2018-05-12 LAB — TSH: TSH: 1.68 u[IU]/mL (ref 0.450–4.500)

## 2018-05-12 LAB — LIPID PANEL
Chol/HDL Ratio: 5.1 ratio — ABNORMAL HIGH (ref 0.0–4.4)
Cholesterol, Total: 203 mg/dL — ABNORMAL HIGH (ref 100–199)
HDL: 40 mg/dL (ref >39)
LDL Calculated: 140 mg/dL — ABNORMAL HIGH (ref 0–99)
Triglycerides: 117 mg/dL (ref 0–149)
VLDL Cholesterol Cal: 23 mg/dL (ref 5–40)

## 2018-05-12 LAB — VITAMIN D 25 HYDROXY (VIT D DEFICIENCY, FRACTURES): VIT D 25 HYDROXY: 35.1 ng/mL (ref 30.0–100.0)

## 2018-05-13 ENCOUNTER — Ambulatory Visit: Payer: No Typology Code available for payment source | Admitting: Certified Nurse Midwife

## 2018-05-13 LAB — URINE CULTURE

## 2018-05-14 ENCOUNTER — Ambulatory Visit: Payer: No Typology Code available for payment source | Admitting: Obstetrics and Gynecology

## 2018-05-14 ENCOUNTER — Telehealth: Payer: Self-pay

## 2018-05-14 ENCOUNTER — Encounter: Payer: Self-pay | Admitting: Obstetrics and Gynecology

## 2018-05-14 NOTE — Telephone Encounter (Signed)
Left message to call Draven Natter, RN at [facility] [phone].   

## 2018-05-14 NOTE — Telephone Encounter (Signed)
Patient returning call.

## 2018-05-14 NOTE — Telephone Encounter (Signed)
-----   Message from Verner Chol, CNM sent at 05/14/2018  8:00 AM EST ----- Notify patient her urine culture showed E.Coli and Macrobid is appropriate medication for treatment Patient status

## 2018-05-14 NOTE — Telephone Encounter (Signed)
Left message for call back.

## 2018-05-17 ENCOUNTER — Other Ambulatory Visit: Payer: Self-pay | Admitting: Obstetrics and Gynecology

## 2018-05-17 DIAGNOSIS — Z1231 Encounter for screening mammogram for malignant neoplasm of breast: Secondary | ICD-10-CM

## 2018-05-17 NOTE — Telephone Encounter (Signed)
Spoke with patient, advised as seen below per Leota Sauers, CNM. Patient reports she is feeling much better. Patient verbalizes understanding and is agreeable.   Routing to provider for final review. Patient is agreeable to disposition. Will close encounter.

## 2018-05-19 ENCOUNTER — Ambulatory Visit: Payer: No Typology Code available for payment source

## 2018-05-19 ENCOUNTER — Ambulatory Visit
Admission: RE | Admit: 2018-05-19 | Discharge: 2018-05-19 | Disposition: A | Discharge status: Home / Self Care | Payer: No Typology Code available for payment source | Source: Ambulatory Visit | Attending: Obstetrics and Gynecology | Admitting: Obstetrics and Gynecology

## 2018-05-19 DIAGNOSIS — Z1231 Encounter for screening mammogram for malignant neoplasm of breast: Secondary | ICD-10-CM

## 2018-05-27 ENCOUNTER — Telehealth: Payer: Self-pay | Admitting: Obstetrics and Gynecology

## 2018-05-27 NOTE — Telephone Encounter (Signed)
Call to patient. Patient states she has been using the nystatin cream as prescribed and still having itching. Patient requesting an appointment to be seen. OV scheduled for 05-28-2018 at 1230 with Dr. Edward Jolly. Patient agreeable to date and time of appointment.  Routing to provider and will close encounter.

## 2018-05-27 NOTE — Telephone Encounter (Signed)
Patient has a question for the nurse °

## 2018-05-27 NOTE — Progress Notes (Signed)
GYNECOLOGY  VISIT   HPI: 53 y.o.   Married  Caucasian/Turkish  female   [phone] with No LMP recorded. (Menstrual status: IUD).   here for vulvar itching/irritation on outside of vulva. Patient has tried clotrimazol without much relief.   Seen for vulvitis and UTI on 05/11/18. Tx with Nystatin ointment and then Macrobid for E Coli UTI.   Scratching during the night.  Feels the area is irritated for one more than one month.   Some clear vaginal discharge.  Changed her bath soap.   GYNECOLOGIC HISTORY: No LMP recorded. (Menstrual status: IUD). Contraception: Mirena IUD Menopausal hormone therapy: none Last mammogram: 05-20-08 3D Neg/density C/BiRads1 Last pap smear:  02-04-18 Neg:Neg HR HPV         OB History    Gravida  4   Para  4   Term      Preterm  2   AB      Living  3     SAB      TAB      Ectopic      Multiple      Live Births  3              Patient Active Problem List   Diagnosis Date Noted  . Adhesive capsulitis of left shoulder 11/02/2017  . Anal or rectal pain 11/02/2017  . Irritable bowel syndrome 11/02/2017  . History of BCG vaccination 10/23/2017  . Depression 07/01/2017  . Asthma 07/01/2017  . Left thyroid nodule 01/30/2017  . Prediabetes 08/03/2016    Past Medical History:  Diagnosis Date  . Abnormal uterine bleeding   . Elevated LDL cholesterol level 2020  . Hypercholesterolemia   . Urinary incontinence     Past Surgical History:  Procedure Laterality Date  . APPENDECTOMY  1996    Current Outpatient Medications  Medication Sig Dispense Refill  . levonorgestrel (MIRENA) 20 MCG/24HR IUD 1 each by Intrauterine route once.    . nystatin ointment (MYCOSTATIN) Apply to affected area 2 times daily for 7 days and then stop for external vaginal and rectal itching 30 g 0   No current facility-administered medications for this visit.      ALLERGIES: Patient has no known allergies.  Family History  Problem Relation Age of  Onset  . Diabetes Mother   . Cancer Paternal Aunt        cervical  . Cancer Maternal Aunt        colon cancer  . Lung cancer Maternal Grandfather     Social History   Socioeconomic History  . Marital status: Married    Spouse name: Not on file  . Number of children: Not on file  . Years of education: Not on file  . Highest education level: Not on file  Occupational History  . Not on file  Social Needs  . Financial resource strain: Not on file  . Food insecurity:    Worry: Not on file    Inability: Not on file  . Transportation needs:    Medical: Not on file    Non-medical: Not on file  Tobacco Use  . Smoking status: Never Smoker  . Smokeless tobacco: Never Used  Substance and Sexual Activity  . Alcohol use: No    Frequency: Never  . Drug use: No  . Sexual activity: Yes    Partners: Male    Birth control/protection: I.U.D.    Comment: Expired Mirena IUD--place 2014  Lifestyle  . Physical activity:  Days per week: Not on file    Minutes per session: Not on file  . Stress: Not on file  Relationships  . Social connections:    Talks on phone: Not on file    Gets together: Not on file    Attends religious service: Not on file    Active member of club or organization: Not on file    Attends meetings of clubs or organizations: Not on file    Relationship status: Not on file  . Intimate partner violence:    Fear of current or ex partner: Not on file    Emotionally abused: Not on file    Physically abused: Not on file    Forced sexual activity: Not on file  Other Topics Concern  . Not on file  Social History Narrative  . Not on file    Review of Systems  All other systems reviewed and are negative.   PHYSICAL EXAMINATION:    BP 112/64 (BP Location: Right Arm, Patient Position: Sitting, Cuff Size: Normal)   Pulse 70   Ht 5' 6.5" (1.689 m)   Wt 142 lb (64.4 kg)   BMI 22.58 kg/m     General appearance: alert, cooperative and appears stated age    Pelvic: External genitalia:  no lesions              Urethra:  normal appearing urethra with no masses, tenderness or lesions              Bartholins and Skenes: normal                 Vagina: normal appearing vagina with normal color and small amount of white thin vaginal  discharge, no lesions              Cervix: no lesions.  IUD strings noted.                  Bimanual Exam:  Uterus:  normal size, contour, position, consistency, mobility, non-tender              Adnexa: no mass, fullness, tenderness                Chaperone was present for exam.  ASSESSMENT  Vulvovaginitis.  No sign of lichen sclerosus. Recent abx tx.   PLAN  Nuswab taken.  We talked about skin friendly products and avoiding new exposures.  Diflucan 150 mg po x 1.  May repeat in 72 hours.  triamcinolone ointment 0.5% to area twice daily as needed.  Vistaril 25 mg po tid prn.  Cautioned her about sleepiness. Aveeno bath.    An After Visit Summary was printed and given to the patient.  ___15___ minutes face to face time of which over 50% was spent in counseling.

## 2018-05-28 ENCOUNTER — Encounter: Payer: Self-pay | Admitting: Obstetrics and Gynecology

## 2018-05-28 ENCOUNTER — Ambulatory Visit (INDEPENDENT_AMBULATORY_CARE_PROVIDER_SITE_OTHER): Payer: No Typology Code available for payment source | Admitting: Obstetrics and Gynecology

## 2018-05-28 ENCOUNTER — Other Ambulatory Visit: Payer: Self-pay

## 2018-05-28 VITALS — BP 112/64 | HR 70 | Ht 66.5 in | Wt 142.0 lb

## 2018-05-28 DIAGNOSIS — N760 Acute vaginitis: Secondary | ICD-10-CM

## 2018-05-28 MED ORDER — TRIAMCINOLONE ACETONIDE 0.5 % EX OINT: 1.0000 "application " | TOPICAL_OINTMENT | Freq: Two times a day (BID) | CUTANEOUS | 0 refills | Status: DC

## 2018-05-28 MED ORDER — HYDROXYZINE PAMOATE 25 MG PO CAPS: 25.0000 mg | ORAL_CAPSULE | Freq: Three times a day (TID) | ORAL | 0 refills | Status: DC | PRN

## 2018-05-28 MED ORDER — FLUCONAZOLE 150 MG PO TABS: 150.0000 mg | ORAL_TABLET | Freq: Once | ORAL | 0 refills | Status: AC

## 2018-05-28 MED FILL — HYDROXYZINE PAM 25 MG CAP: 25 | 10 days supply | Qty: 30 | Fill #0

## 2018-05-28 MED FILL — FLUCONAZOLE 150 MG TABS: 150 | 3 days supply | Qty: 2 | Fill #0

## 2018-05-28 MED FILL — TRIAMCINOLONE 0.5% OINTMENT: 0.5 | 7 days supply | Qty: 30 | Fill #0

## 2018-05-28 NOTE — Patient Instructions (Addendum)
Try an Aveeno bath to relieve your itching and irritation.   Vaginitis Vaginitis is a condition in which the vaginal tissue swells and becomes red (inflamed). This condition is most often caused by a change in the normal balance of bacteria and yeast that live in the vagina. This change causes an overgrowth of certain bacteria or yeast, which causes the inflammation. There are different types of vaginitis, but the most common types are:  Bacterial vaginosis.  Yeast infection (candidiasis).  Trichomoniasis vaginitis. This is a sexually transmitted disease (STD).  Viral vaginitis.  Atrophic vaginitis.  Allergic vaginitis. What are the causes? The cause of this condition depends on the type of vaginitis. It can be caused by:  Bacteria (bacterial vaginosis).  Yeast, which is a fungus (yeast infection).  A parasite (trichomoniasis vaginitis).  A virus (viral vaginitis).  Low hormone levels (atrophic vaginitis). Low hormone levels can occur during pregnancy, breastfeeding, or after menopause.  Irritants, such as bubble baths, scented tampons, and feminine sprays (allergic vaginitis). Other factors can change the normal balance of the yeast and bacteria that live in the vagina. These include:  Antibiotic medicines.  Poor hygiene.  Diaphragms, vaginal sponges, spermicides, birth control pills, and intrauterine devices (IUD).  Sex.  Infection.  Uncontrolled diabetes.  A weakened defense (immune) system. What increases the risk? This condition is more likely to develop in women who:  Smoke.  Use vaginal douches, scented tampons, or scented sanitary pads.  Wear tight-fitting pants.  Wear thong underwear.  Use oral birth control pills or an IUD.  Have sex without a condom.  Have multiple sex partners.  Have an STD.  Frequently use the spermicide nonoxynol-9.  Eat lots of foods high in sugar.  Have uncontrolled diabetes.  Have low estrogen levels.  Have a  weakened immune system from an immune disorder or medical treatment.  Are pregnant or breastfeeding. What are the signs or symptoms? Symptoms vary depending on the cause of the vaginitis. Common symptoms include:  Abnormal vaginal discharge. ? The discharge is white, gray, or yellow with bacterial vaginosis. ? The discharge is thick, white, and cheesy with a yeast infection. ? The discharge is frothy and yellow or greenish with trichomoniasis.  A bad vaginal smell. The smell is fishy with bacterial vaginosis.  Vaginal itching, pain, or swelling.  Sex that is painful.  Pain or burning when urinating. Sometimes there are no symptoms. How is this diagnosed? This condition is diagnosed based on your symptoms and medical history. A physical exam, including a pelvic exam, will also be done. You may also have other tests, including:  Tests to determine the pH level (acidity or alkalinity) of your vagina.  A whiff test, to assess the odor that results when a sample of your vaginal discharge is mixed with a potassium hydroxide solution.  Tests of vaginal fluid. A sample will be examined under a microscope. How is this treated? Treatment varies depending on the type of vaginitis you have. Your treatment may include:  Antibiotic creams or pills to treat bacterial vaginosis and trichomoniasis.  Antifungal medicines, such as vaginal creams or suppositories, to treat a yeast infection.  Medicine to ease discomfort if you have viral vaginitis. Your sexual partner should also be treated.  Estrogen delivered in a cream, pill, suppository, or vaginal ring to treat atrophic vaginitis. If vaginal dryness occurs, lubricants and moisturizing creams may help. You may need to avoid scented soaps, sprays, or douches.  Stopping use of a product that is causing  allergic vaginitis. Then using a vaginal cream to treat the symptoms. Follow these instructions at home: Lifestyle  Keep your genital area  clean and dry. Avoid soap, and only rinse the area with water.  Do not douche or use tampons until your health care provider says it is okay to do so. Use sanitary pads, if needed.  Do not have sex until your health care provider approves. When you can return to sex, practice safe sex and use condoms.  Wipe from front to back. This avoids the spread of bacteria from the rectum to the vagina. General instructions  Take over-the-counter and prescription medicines only as told by your health care provider.  If you were prescribed an antibiotic medicine, take or use it as told by your health care provider. Do not stop taking or using the antibiotic even if you start to feel better.  Keep all follow-up visits as told by your health care provider. This is important. How is this prevented?  Use mild, non-scented products. Do not use things that can irritate the vagina, such as fabric softeners. Avoid the following products if they are scented: ? Feminine sprays. ? Detergents. ? Tampons. ? Feminine hygiene products. ? Soaps or bubble baths.  Let air reach your genital area. ? Wear cotton underwear to reduce moisture buildup. ? Avoid wearing underwear while you sleep. ? Avoid wearing tight pants and underwear or nylons without a cotton panel. ? Avoid wearing thong underwear.  Take off any wet clothing, such as bathing suits, as soon as possible.  Practice safe sex and use condoms. Contact a health care provider if:  You have abdominal pain.  You have a fever.  You have symptoms that last for more than 2-3 days. Get help right away if:  You have a fever and your symptoms suddenly get worse. Summary  Vaginitis is a condition in which the vaginal tissue becomes inflamed.This condition is most often caused by a change in the normal balance of bacteria and yeast that live in the vagina.  Treatment varies depending on the type of vaginitis you have.  Do not douche, use tampons , or  have sex until your health care provider approves. When you can return to sex, practice safe sex and use condoms. This information is not intended to replace advice given to you by your health care provider. Make sure you discuss any questions you have with your health care provider. Document Released: 02/09/2007 Document Revised: 05/20/2016 Document Reviewed: 05/20/2016 Elsevier Interactive Patient Education  2019 [employer].

## 2018-06-01 LAB — NUSWAB VAGINITIS (VG)
Candida albicans, NAA: NEGATIVE
Candida glabrata, NAA: NEGATIVE
Trich vag by NAA: NEGATIVE

## 2018-07-09 ENCOUNTER — Other Ambulatory Visit: Payer: Self-pay

## 2018-07-09 NOTE — Progress Notes (Unsigned)
Called in albuterol inhaler 1-2 puffs q6 hrs prn SOB 1 inhaler with 2 refills to [facility]

## 2018-07-10 MED FILL — VENTOLIN HFA 90 MCG INHALER: 108 (90 BAS | 25 days supply | Qty: 18 | Fill #0

## 2018-08-20 ENCOUNTER — Other Ambulatory Visit: Payer: Self-pay

## 2018-08-20 ENCOUNTER — Ambulatory Visit (INDEPENDENT_AMBULATORY_CARE_PROVIDER_SITE_OTHER): Payer: No Typology Code available for payment source | Admitting: Family Medicine

## 2018-08-20 ENCOUNTER — Encounter: Payer: Self-pay | Admitting: Family Medicine

## 2018-08-20 VITALS — BP 100/60 | HR 76 | Ht 66.0 in | Wt 141.0 lb

## 2018-08-20 DIAGNOSIS — B373 Candidiasis of vulva and vagina: Secondary | ICD-10-CM

## 2018-08-20 DIAGNOSIS — B3731 Acute candidiasis of vulva and vagina: Secondary | ICD-10-CM

## 2018-08-20 DIAGNOSIS — N3090 Cystitis, unspecified without hematuria: Secondary | ICD-10-CM | POA: Diagnosis not present

## 2018-08-20 LAB — POCT URINALYSIS DIPSTICK
Bilirubin, UA: NEGATIVE
Blood, UA: NEGATIVE
Glucose, UA: NEGATIVE
Ketones, UA: NEGATIVE
Nitrite, UA: NEGATIVE
Protein, UA: NEGATIVE
Spec Grav, UA: 1.015 (ref 1.010–1.025)
Urobilinogen, UA: 0.2 E.U./dL
pH, UA: 6 (ref 5.0–8.0)

## 2018-08-20 MED ORDER — CLOTRIMAZOLE 1 % EX CREA: 1.0000 "application " | TOPICAL_CREAM | Freq: Two times a day (BID) | CUTANEOUS | 1 refills | Status: DC

## 2018-08-20 MED ORDER — FLUCONAZOLE 150 MG PO TABS: 150.0000 mg | ORAL_TABLET | Freq: Once | ORAL | 1 refills | Status: AC

## 2018-08-20 MED ORDER — NITROFURANTOIN MONOHYD MACRO 100 MG PO CAPS: 100.0000 mg | ORAL_CAPSULE | Freq: Two times a day (BID) | ORAL | 1 refills | Status: DC

## 2018-08-20 MED FILL — NITROFURANTOIN MONO-MCR 100: 100 | 7 days supply | Qty: 14 | Fill #0

## 2018-08-20 MED FILL — FLUCONAZOLE 150 MG TABS: 150 | 2 days supply | Qty: 2 | Fill #0

## 2018-08-20 NOTE — Progress Notes (Signed)
Date:  08/20/2018   Name:  Autumn CrandallYasemin Brown   DOB:  Apr 21, 1966   MRN:  [id number]   Chief Complaint: Urinary Tract Infection (was given macrobid in the past- wants a diflucan as well)  Urinary Tract Infection   This is a new problem. The current episode started yesterday. The problem occurs intermittently. The problem has been waxing and waning. The quality of the pain is described as burning. The pain is mild. There has been no fever. Associated symptoms include a discharge, frequency and urgency. Pertinent negatives include no chills, flank pain, hematuria, hesitancy, nausea, sweats or vomiting. Associated symptoms comments: Suprapubic/no dysuria. The treatment provided moderate relief. There is no history of catheterization, kidney stones, recurrent UTIs, a single kidney, urinary stasis or a urological procedure.    Review of Systems  Constitutional: Negative.  Negative for chills, fatigue, fever and unexpected weight change.  HENT: Negative for congestion, ear discharge, ear pain, rhinorrhea, sinus pressure, sneezing and sore throat.   Eyes: Negative for photophobia, pain, discharge, redness and itching.  Respiratory: Negative for cough, shortness of breath, wheezing and stridor.   Cardiovascular: Negative for chest pain, palpitations and leg swelling.  Gastrointestinal: Negative for abdominal pain, blood in stool, constipation, diarrhea, nausea and vomiting.  Endocrine: Negative for cold intolerance, heat intolerance, polydipsia, polyphagia and polyuria.  Genitourinary: Positive for frequency and urgency. Negative for dysuria, flank pain, hematuria, hesitancy, menstrual problem, pelvic pain, vaginal bleeding and vaginal discharge.  Musculoskeletal: Negative for arthralgias, back pain and myalgias.  Skin: Negative for rash.  Allergic/Immunologic: Negative for environmental allergies and food allergies.  Neurological: Negative for dizziness, weakness, light-headedness, numbness and  headaches.  Hematological: Negative for adenopathy. Does not bruise/bleed easily.  Psychiatric/Behavioral: Negative for dysphoric mood. The patient is not nervous/anxious.     Patient Active Problem List   Diagnosis Date Noted  . Adhesive capsulitis of left shoulder 11/02/2017  . Anal or rectal pain 11/02/2017  . Irritable bowel syndrome 11/02/2017  . History of BCG vaccination 10/23/2017  . Depression 07/01/2017  . Asthma 07/01/2017  . Left thyroid nodule 01/30/2017  . Prediabetes 08/03/2016    No Known Allergies  Past Surgical History:  Procedure Laterality Date  . APPENDECTOMY  1996    Social History   Tobacco Use  . Smoking status: Never Smoker  . Smokeless tobacco: Never Used  Substance Use Topics  . Alcohol use: No    Frequency: Never  . Drug use: No     Medication list has been reviewed and updated.  Current Meds  Medication Sig  . levonorgestrel (MIRENA) 20 MCG/24HR IUD 1 each by Intrauterine route once.  . nystatin ointment (MYCOSTATIN) Apply to affected area 2 times daily for 7 days and then stop for external vaginal and rectal itching  . triamcinolone ointment (KENALOG) 0.5 % Apply 1 application topically 2 (two) times daily. Use as needed.  . [DISCONTINUED] hydrOXYzine (VISTARIL) 25 MG capsule Take 1 capsule (25 mg total) by mouth 3 (three) times daily as needed.    PHQ 2/9 Scores 01/11/2018 12/18/2017 07/27/2017  PHQ - 2 Score 0 0 0  PHQ- 9 Score 0 1 -    BP Readings from Last 3 Encounters:  08/20/18 100/60  05/28/18 112/64  05/11/18 108/63    Physical Exam Vitals signs and nursing note reviewed.  Constitutional:      General: She is not in acute distress.    Appearance: She is not diaphoretic.  HENT:     Head: Normocephalic  and atraumatic.     Right Ear: Tympanic membrane, ear canal and external ear normal.     Left Ear: Tympanic membrane, ear canal and external ear normal.     Nose: Nose normal.  Eyes:     General:        Right eye: No  discharge.        Left eye: No discharge.     Conjunctiva/sclera: Conjunctivae normal.     Pupils: Pupils are equal, round, and reactive to light.  Neck:     Musculoskeletal: Normal range of motion and neck supple.     Thyroid: No thyromegaly.     Vascular: No JVD.  Cardiovascular:     Rate and Rhythm: Normal rate and regular rhythm.     Heart sounds: S1 normal and S2 normal. No murmur. No friction rub. No gallop. No S3 or S4 sounds.   Pulmonary:     Effort: Pulmonary effort is normal.     Breath sounds: Normal breath sounds. No wheezing, rhonchi or rales.  Abdominal:     General: Bowel sounds are normal.     Palpations: Abdomen is soft. There is no hepatomegaly, splenomegaly or mass.     Tenderness: There is abdominal tenderness in the suprapubic area. There is no right CVA tenderness, left CVA tenderness or guarding.  Musculoskeletal: Normal range of motion.     Lumbar back: She exhibits spasm.     Comments: Mild tenderness  Lymphadenopathy:     Cervical: No cervical adenopathy.  Skin:    General: Skin is warm and dry.  Neurological:     Mental Status: She is alert.     Deep Tendon Reflexes: Reflexes are normal and symmetric.     Wt Readings from Last 3 Encounters:  08/20/18 141 lb (64 kg)  05/28/18 142 lb (64.4 kg)  05/11/18 140 lb 12.8 oz (63.9 kg)    BP 100/60   Pulse 76   Ht 5\' 6"  (1.676 m)   Wt 141 lb (64 kg)   BMI 22.76 kg/m   Assessment and Plan: 1. Candidiasis of genitalia in female Patient relates that she has an irritation in the periurethral area for which she would like a cream discussed the disadvantage of using only a steroid cream without an antifungal patient was given Chlortrimazole with type with fluconazole. - fluconazole (DIFLUCAN) 150 MG tablet; Take 1 tablet (150 mg total) by mouth once for 1 dose.  Dispense: 2 tablet; Refill: 1 - clotrimazole (LOTRIMIN) 1 % cream; Apply 1 application topically 2 (two) times daily.  Dispense: 30 g; Refill: 1   2. Cystitis Cute.  Versus recurrent.  Uncertain as to repetitive nature of this infection.  Patient was given a refill of nitrofurantoin 100 mg twice a day for 7-day.  With a refill if there should be a rapid relapse.  Also discussed about the recurrent nature but may be being secondary to a cystocele and the need for a urology consult in the future to determine whether phylactic antibiotics should be initiated. - nitrofurantoin, macrocrystal-monohydrate, (MACROBID) 100 MG capsule; Take 1 capsule (100 mg total) by mouth 2 (two) times daily.  Dispense: 14 capsule; Refill: 1 - POCT urinalysis dipstick

## 2018-09-08 MED FILL — NITROFURANTOIN MONO-MCR 100: 100 | 7 days supply | Qty: 14 | Fill #1

## 2018-09-08 MED FILL — FLUCONAZOLE 150 MG TABLET: 150 | 2 days supply | Qty: 2 | Fill #1

## 2018-11-03 ENCOUNTER — Encounter: Payer: Self-pay | Admitting: Family Medicine

## 2019-02-14 ENCOUNTER — Encounter: Payer: Self-pay | Admitting: Family Medicine

## 2019-02-17 ENCOUNTER — Other Ambulatory Visit: Payer: Self-pay

## 2019-02-17 ENCOUNTER — Encounter: Payer: Self-pay | Admitting: Family Medicine

## 2019-02-17 ENCOUNTER — Ambulatory Visit (INDEPENDENT_AMBULATORY_CARE_PROVIDER_SITE_OTHER): Payer: No Typology Code available for payment source | Admitting: Family Medicine

## 2019-02-17 VITALS — BP 112/76 | HR 79 | Resp 16 | Ht 66.0 in | Wt 140.0 lb

## 2019-02-17 DIAGNOSIS — I499 Cardiac arrhythmia, unspecified: Secondary | ICD-10-CM

## 2019-02-17 DIAGNOSIS — B373 Candidiasis of vulva and vagina: Secondary | ICD-10-CM | POA: Diagnosis not present

## 2019-02-17 DIAGNOSIS — K219 Gastro-esophageal reflux disease without esophagitis: Secondary | ICD-10-CM

## 2019-02-17 DIAGNOSIS — B001 Herpesviral vesicular dermatitis: Secondary | ICD-10-CM

## 2019-02-17 DIAGNOSIS — M546 Pain in thoracic spine: Secondary | ICD-10-CM

## 2019-02-17 DIAGNOSIS — Z8619 Personal history of other infectious and parasitic diseases: Secondary | ICD-10-CM | POA: Diagnosis not present

## 2019-02-17 DIAGNOSIS — G8929 Other chronic pain: Secondary | ICD-10-CM

## 2019-02-17 DIAGNOSIS — B3731 Acute candidiasis of vulva and vagina: Secondary | ICD-10-CM

## 2019-02-17 MED ORDER — CYCLOBENZAPRINE HCL 5 MG PO TABS: 5.0000 mg | ORAL_TABLET | Freq: Two times a day (BID) | ORAL | 1 refills | Status: DC | PRN

## 2019-02-17 MED ORDER — OMEPRAZOLE 20 MG PO CPDR: 20.0000 mg | DELAYED_RELEASE_CAPSULE | Freq: Every day | ORAL | 3 refills | Status: DC

## 2019-02-17 MED ORDER — NYSTATIN-TRIAMCINOLONE 100000-0.1 UNIT/GM-% EX CREA: 1.0000 "application " | TOPICAL_CREAM | Freq: Two times a day (BID) | CUTANEOUS | 5 refills | Status: DC

## 2019-02-17 MED ORDER — ACYCLOVIR 5 % EX CREA: 1.0000 "application " | TOPICAL_CREAM | CUTANEOUS | 0 refills | Status: DC

## 2019-02-17 MED ORDER — DICLOFENAC SODIUM 1 % TD GEL: 2.0000 g | Freq: Four times a day (QID) | TRANSDERMAL | 6 refills | Status: DC

## 2019-02-17 MED FILL — DICLOFENAC SODIUM 1 % GEL: 1 | 12 days supply | Qty: 100 | Fill #0

## 2019-02-17 MED FILL — CYCLOBENZAPRINE HCL 5 MG TA: 5 | 30 days supply | Qty: 60 | Fill #0

## 2019-02-17 MED FILL — OMEPRAZOLE 20 MG CAP: 20 | 30 days supply | Qty: 30 | Fill #0

## 2019-02-17 MED FILL — NYSTATIN-TRIAMCINOLONE CRM: 100000-0.1 | 14 days supply | Qty: 30 | Fill #0

## 2019-02-17 NOTE — Progress Notes (Signed)
Date:  02/17/2019   Name:  Autumn Brown   DOB:  [birth year]   MRN:  [id number]   Chief Complaint: Irregular Heart Beat (wants ref hx of myocarditis with L Bundle Branch), Back Pain (upper thorasic lower Lumbar denies injury ), Vaginitis (wants to have steroid with nystatin and Diflucan for menopause vaginal itching ), and Herpes Zoster (cream for cold sores )  Back Pain This is a chronic problem. The current episode started more than 1 month ago (4-5 month). The problem occurs intermittently. The problem has been waxing and waning since onset. The pain is present in the thoracic spine. The quality of the pain is described as aching. The pain is moderate. Pertinent negatives include no chest pain, numbness or paresis. She has tried muscle relaxant for the symptoms. The treatment provided mild relief.  Palpitations  This is a chronic (random extraheart beats) problem. The current episode started more than 1 year ago. The problem occurs 2 to 4 times per day. The problem has been unchanged. Pertinent negatives include no chest fullness, chest pain, dizziness, irregular heartbeat, numbness, shortness of breath or syncope. Risk factors include dyslipidemia.    Review of Systems  Respiratory: Negative for shortness of breath.   Cardiovascular: Positive for palpitations. Negative for chest pain and syncope.  Musculoskeletal: Positive for back pain.  Neurological: Negative for dizziness and numbness.    Patient Active Problem List   Diagnosis Date Noted  . Adhesive capsulitis of left shoulder 11/02/2017  . Anal or rectal pain 11/02/2017  . Irritable bowel syndrome 11/02/2017  . History of BCG vaccination 10/23/2017  . Depression 07/01/2017  . Asthma 07/01/2017  . Left thyroid nodule 01/30/2017  . Prediabetes 08/03/2016    No Known Allergies  Past Surgical History:  Procedure Laterality Date  . APPENDECTOMY  1996    Social History   Tobacco Use  . Smoking status: Never Smoker   . Smokeless tobacco: Never Used  Substance Use Topics  . Alcohol use: No    Frequency: Never  . Drug use: No     Medication list has been reviewed and updated.  Current Meds  Medication Sig  . levonorgestrel (MIRENA) 20 MCG/24HR IUD 1 each by Intrauterine route once.    PHQ 2/9 Scores 02/17/2019 01/11/2018 12/18/2017 07/27/2017  PHQ - 2 Score 0 0 0 0  PHQ- 9 Score 0 0 1 -    BP Readings from Last 3 Encounters:  02/17/19 112/76  08/20/18 100/60  05/28/18 112/64    Physical Exam  Wt Readings from Last 3 Encounters:  02/17/19 140 lb (63.5 kg)  08/20/18 141 lb (64 kg)  05/28/18 142 lb (64.4 kg)    BP 112/76   Pulse 79   Resp 16   Ht 5\' 6"  (1.676 m)   Wt 140 lb (63.5 kg)   SpO2 98%   BMI 22.60 kg/m   Assessment and Plan: 1. Irregular heart beat Patient has noticed some extra heartbeats with runs and without symptoms of near syncope.  There is some question about myocarditis after taking the yellow fever vaccine or the possibility of Lyme disease.  Patient desires to have further evaluation of this plus the fact she has a left bundle branch block and is has distorted the voltage criteria for LVH and for ischemic heart disease.  Will refer to cardiology for evaluation.  In the meantime I have given her the [facility] low-cholesterol low triglyceride diet. - EKG 12-Lead - Ambulatory referral to  Cardiology  2. Candidiasis of genitalia in female Patient has recurrent candidiasis infection and wants a compound of nystatin and steroid we will initiate nystatin triamcinolone. - nystatin-triamcinolone (MYCOLOG II) cream; Apply 1 application topically 2 (two) times daily.  Dispense: 30 g; Refill: 5  3. History of candidiasis See above  4. Recurrent herpes labialis Patient has herpes simplex one of the lips on occasions and would like a topical to apply we will use some Zovirax cream and if necessary her next that would be Denavir which is very expensive. - acyclovir  cream (ZOVIRAX) 5 %; Apply 1 application topically every 4 (four) hours. prn  Dispense: 15 g; Refill: 0  5. Gastroesophageal reflux disease, unspecified whether esophagitis present Patient relates that she has occasional heartburn and would like to be on omeprazole.  We will prescribe omeprazole 20 mg once a day. - omeprazole (PRILOSEC) 20 MG capsule; Take 1 capsule (20 mg total) by mouth daily.  Dispense: 30 capsule; Refill: 3  6. Chronic thoracic back pain, unspecified back pain laterality Patient has chronic thoracic back pain which is episodic in nature associated with activity and chores.  We will initiate Voltaren gel to apply on a as needed basis and cyclobenzaprine for muscle relaxation 1 during the day if necessary at the 5 mg may increase to 2 at night.  Patient has asked about chiropractor and massage therapy I have offered physical therapy but I do not think that she would like to go in that direction.  I have instructed her if she finds a chiropractor with exceptions we will attempt to get a referral if her insurance will approve it. - diclofenac sodium (VOLTAREN) 1 % GEL; Apply 2 g topically 4 (four) times daily.  Dispense: 50 g; Refill: 6 - cyclobenzaprine (FLEXERIL) 5 MG tablet; Take 1 tablet (5 mg total) by mouth 2 (two) times daily as needed for muscle spasms.  Dispense: 60 tablet; Refill: 1  I have reviewed EKG which shows reviewed shows atrial rhythm with a rate of 74 normal intervals except for those associated with left bundle branch block i.e. QRS is greater than 0.12 there is a suggestion of left ventricular hypertrophy but this is distorted by the left bundle branch block and this is due to an intraventricular conduction delay.  There is also a suggestion of a possible inferior ischemic concern but patient has not had any current symptoms.  We will refer to cardiology for evaluation for myocarditis concern.  Comparison to previous EKG dated 2018.

## 2019-02-17 NOTE — Patient Instructions (Signed)

## 2019-04-04 ENCOUNTER — Encounter: Payer: Self-pay | Admitting: Family Medicine

## 2019-04-05 ENCOUNTER — Other Ambulatory Visit: Payer: Self-pay

## 2019-04-05 DIAGNOSIS — Z0100 Encounter for examination of eyes and vision without abnormal findings: Secondary | ICD-10-CM

## 2019-04-05 NOTE — Progress Notes (Unsigned)
Routine eye exam ref placed/ Dr Melissa Noon

## 2019-04-11 ENCOUNTER — Encounter: Payer: Self-pay | Admitting: Family Medicine

## 2019-04-11 ENCOUNTER — Other Ambulatory Visit: Payer: Self-pay

## 2019-04-11 DIAGNOSIS — B001 Herpesviral vesicular dermatitis: Secondary | ICD-10-CM

## 2019-04-14 ENCOUNTER — Telehealth: Payer: Self-pay

## 2019-04-14 MED FILL — ACYCLOVIR 5% OINTMENT: 5 | 7 days supply | Qty: 15 | Fill #0

## 2019-04-14 NOTE — Telephone Encounter (Signed)
Switched pt from acyclovir cream to ointment due to insurance not paing- contacted pharmacy and pt for pick up

## 2019-04-15 MED FILL — NYSTATIN-TRIAMCINOLONE CRM: 100000-0.1 | 14 days supply | Qty: 30 | Fill #1

## 2019-04-15 MED FILL — CYCLOBENZAPRINE HCL 5 MG TA: 5 | 30 days supply | Qty: 60 | Fill #1

## 2019-04-15 MED FILL — DICLOFENAC SODIUM 1 % GEL: 1 | 12 days supply | Qty: 100 | Fill #1

## 2019-05-09 DIAGNOSIS — R002 Palpitations: Secondary | ICD-10-CM | POA: Diagnosis not present

## 2019-05-13 ENCOUNTER — Other Ambulatory Visit: Payer: Self-pay | Admitting: Internal Medicine

## 2019-05-13 DIAGNOSIS — I447 Left bundle-branch block, unspecified: Secondary | ICD-10-CM

## 2019-05-13 DIAGNOSIS — R0602 Shortness of breath: Secondary | ICD-10-CM

## 2019-05-25 ENCOUNTER — Other Ambulatory Visit: Payer: Self-pay

## 2019-05-25 ENCOUNTER — Ambulatory Visit
Admission: RE | Admit: 2019-05-25 | Discharge: 2019-05-25 | Disposition: A | Discharge status: Home / Self Care | Payer: 59 | Source: Ambulatory Visit | Attending: Internal Medicine | Admitting: Internal Medicine

## 2019-05-25 DIAGNOSIS — I447 Left bundle-branch block, unspecified: Secondary | ICD-10-CM | POA: Diagnosis not present

## 2019-05-25 DIAGNOSIS — R0602 Shortness of breath: Secondary | ICD-10-CM

## 2019-05-25 DIAGNOSIS — E7800 Pure hypercholesterolemia, unspecified: Secondary | ICD-10-CM | POA: Diagnosis not present

## 2019-05-25 NOTE — Progress Notes (Signed)
*  PRELIMINARY RESULTS* Echocardiogram 2D Echocardiogram has been performed.  Autumn Brown 05/25/2019, 12:02 PM

## 2019-07-13 DIAGNOSIS — I447 Left bundle-branch block, unspecified: Secondary | ICD-10-CM | POA: Diagnosis not present

## 2019-07-13 DIAGNOSIS — R002 Palpitations: Secondary | ICD-10-CM | POA: Diagnosis not present

## 2019-07-13 DIAGNOSIS — R0602 Shortness of breath: Secondary | ICD-10-CM | POA: Diagnosis not present

## 2019-07-18 ENCOUNTER — Encounter: Payer: Self-pay | Admitting: Certified Nurse Midwife

## 2019-08-22 ENCOUNTER — Telehealth: Payer: Self-pay

## 2019-08-22 NOTE — Telephone Encounter (Signed)
AEX 01/2018 OV 04/2018- +UTI, vulvovaginits  IUD-Mirena placed 2014 for adenomyosis- has no cycle bleeding PMP - No HRT    Spoke with pt. Pt states started having heavy bleeding x 1 day on 4/22 and now dark to light brown bleeding/spotting that has continued for past 4 days. Pt states has hx of AUB in past.  Pt denies changing pad/tampon, only as needed. Pt not wearing pad or panty liner currently.  Denies UTI sx, vasomotor sx and any clots or cramping.   Pt advised to be seen for further evaluation.  Pt agreeable. Pt scheduled for OV with Dr Edward Jolly on 4/29 at 2:30 pm. Pt declines earlier appt due to work schedule. Pt verbalized understanding of date and time.  Will review with Dr Edward Jolly and return call to pt with any further recommendations or advice. Pt given ER precautions. Pt agreeable.   Routing to Dr Edward Jolly

## 2019-08-22 NOTE — Telephone Encounter (Signed)
Patient called in regards to irregular bleeding. Patient stated she perfers an appointment on Thursday 4/29 or Friday 4/30 at the latest time. Need triage to assist.

## 2019-08-23 NOTE — Telephone Encounter (Signed)
Encounter reviewed and closed.  

## 2019-08-24 ENCOUNTER — Other Ambulatory Visit: Payer: Self-pay

## 2019-08-24 NOTE — Progress Notes (Signed)
GYNECOLOGY  VISIT   HPI: 54 y.o.   Married  [place]  female   [phone] with Patient's last menstrual period was 08/18/2019 (exact date).   here for abnormal uterine bleeding.   No cycles with Mirena IUD but began having vaginal bleeding on 08-18-19.  This was followed by dark blood for a couple of days.  No vaginal discharge or pain.   Also has a painful bleeding hemorrhoid.  IUD placed in her home country of [place] in 2015 for adenomyosis.   FSH 58.5 and estradiol <5 on 02/04/18.  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  She scanned herself and noted her EMS is thin and her ovaries are normal.  GYNECOLOGIC HISTORY: Patient's last menstrual period was 08/18/2019 (exact date). Contraception: PMP/Mirena IUD--2014 for adenomyosis Menopausal hormone therapy: none Last mammogram: 05-20-18 3D/Neg/density C/BiRads1 Last pap smear: 02-04-18 Neg:Neg HR HPV         OB History    Gravida  4   Para  4   Term      Preterm  2   AB      Living  3     SAB      TAB      Ectopic      Multiple      Live Births  3              Patient Active Problem List   Diagnosis Date Noted  . Adhesive capsulitis of left shoulder 11/02/2017  . Anal or rectal pain 11/02/2017  . Irritable bowel syndrome 11/02/2017  . History of BCG vaccination 10/23/2017  . Depression 07/01/2017  . Asthma 07/01/2017  . Left thyroid nodule 01/30/2017  . Prediabetes 08/03/2016    Past Medical History:  Diagnosis Date  . Abnormal uterine bleeding   . Elevated LDL cholesterol level 2020  . Hypercholesterolemia   . Urinary incontinence     Past Surgical History:  Procedure Laterality Date  . APPENDECTOMY  1996    Current Outpatient Medications  Medication Sig Dispense Refill  . chlorhexidine (PERIDEX) 0.12 % solution     . cyclobenzaprine (FLEXERIL) 5 MG tablet Take 1 tablet (5 mg total) by mouth 2 (two) times daily as needed for muscle spasms. 60  tablet 1  . levonorgestrel (MIRENA) 20 MCG/24HR IUD 1 each by Intrauterine route once.    . nystatin ointment (MYCOSTATIN) Apply to affected area 2 times daily for 7 days and then stop for external vaginal and rectal itching 30 g 0  . nystatin-triamcinolone (MYCOLOG II) cream Apply 1 application topically 2 (two) times daily. 30 g 5  . sertraline (ZOLOFT) 50 MG tablet Take 50 mg by mouth daily.    . hydrocortisone-pramoxine (PROCTOFOAM-HC) rectal foam Place 1 applicator rectally 2 (two) times daily. 30 g 0   No current facility-administered medications for this visit.     ALLERGIES: Patient has no known allergies.  Family History  Problem Relation Age of Onset  . Diabetes Mother   . Cancer Paternal Aunt        cervical  . Cancer Maternal Aunt        colon cancer  . Lung cancer Maternal Grandfather     Social History   Socioeconomic History  . Marital status: Married    Spouse name: Not on file  . Number of children: Not on file  . Years of education: Not on file  . Highest education level: Not  on file  Occupational History  . Not on file  Tobacco Use  . Smoking status: Never Smoker  . Smokeless tobacco: Never Used  Substance and Sexual Activity  . Alcohol use: No  . Drug use: No  . Sexual activity: Yes    Partners: Male    Birth control/protection: I.U.D.    Comment: Expired Mirena IUD--place 2014  Other Topics Concern  . Not on file  Social History Narrative  . Not on file   Social Determinants of Health   Financial Resource Strain:   . Difficulty of Paying Living Expenses:   Food Insecurity:   . Worried About Programme researcher, broadcasting/film/video in the Last Year:   . Barista in the Last Year:   Transportation Needs:   . Freight forwarder (Medical):   Marland Kitchen Lack of Transportation (Non-Medical):   Physical Activity:   . Days of Exercise per Week:   . Minutes of Exercise per Session:   Stress:   . Feeling of Stress :   Social Connections:   . Frequency of  Communication with Friends and Family:   . Frequency of Social Gatherings with Friends and Family:   . Attends Religious Services:   . Active Member of Clubs or Organizations:   . Attends Banker Meetings:   Marland Kitchen Marital Status:   Intimate Partner Violence:   . Fear of Current or Ex-Partner:   . Emotionally Abused:   Marland Kitchen Physically Abused:   . Sexually Abused:     Review of Systems  All other systems reviewed and are negative.   PHYSICAL EXAMINATION:    BP 106/70   Pulse 64   Temp (!) 97.3 F (36.3 C) (Temporal)   Ht 5' 6.5" (1.689 m)   Wt 136 lb (61.7 kg)   LMP 08/18/2019 (Exact Date)   BMI 21.62 kg/m     General appearance: alert, cooperative and appears stated age    Pelvic: External genitalia:  no lesions              Urethra:  normal appearing urethra with no masses, tenderness or lesions              Bartholins and Skenes: normal                 Vagina: normal appearing vagina with normal color and discharge, no lesions              Cervix: no lesions.  Brownish discharge.  IUD strings noted.                Bimanual Exam:  Uterus:  normal size, contour, position, consistency, mobility, non-tender              Adnexa: no mass, fullness, tenderness             Chaperone was present for exam.  ASSESSMENT  Postmenopausal bleeding.  Expired IUD.  Hemorrhoid.  PLAN  Return for pelvic US and removal of expired Mirena IUD.  Will determine if anything further is needed at that time. Proctofoam to rectum bid.    An After Visit Summary was printed and given to the patient.  __20____ minutes face to face time of which over 50% was spent in counseling.

## 2019-08-25 ENCOUNTER — Encounter: Payer: Self-pay | Admitting: Obstetrics and Gynecology

## 2019-08-25 ENCOUNTER — Ambulatory Visit (INDEPENDENT_AMBULATORY_CARE_PROVIDER_SITE_OTHER): Payer: 59 | Admitting: Obstetrics and Gynecology

## 2019-08-25 VITALS — BP 106/70 | HR 64 | Temp 97.3°F | Ht 66.5 in | Wt 136.0 lb

## 2019-08-25 DIAGNOSIS — K649 Unspecified hemorrhoids: Secondary | ICD-10-CM | POA: Diagnosis not present

## 2019-08-25 DIAGNOSIS — N950 Postmenopausal bleeding: Secondary | ICD-10-CM | POA: Diagnosis not present

## 2019-08-25 MED ORDER — HYDROCORT-PRAMOXINE (PERIANAL) 1-1 % EX FOAM: 1.0000 | Freq: Two times a day (BID) | CUTANEOUS | 0 refills | Status: DC

## 2019-08-31 ENCOUNTER — Telehealth: Payer: Self-pay | Admitting: Obstetrics and Gynecology

## 2019-08-31 NOTE — Telephone Encounter (Signed)
Call placed to convey benefits. Spoke with the patient and conveyed the benefits. Patient understands/agreeable with the benefits. Patient is aware of the cancellation policy. Appointment scheduled 09/15/19. 

## 2019-09-14 ENCOUNTER — Other Ambulatory Visit: Payer: Self-pay

## 2019-09-15 ENCOUNTER — Ambulatory Visit (INDEPENDENT_AMBULATORY_CARE_PROVIDER_SITE_OTHER): Payer: 59 | Admitting: Obstetrics and Gynecology

## 2019-09-15 ENCOUNTER — Encounter: Payer: Self-pay | Admitting: Obstetrics and Gynecology

## 2019-09-15 ENCOUNTER — Ambulatory Visit (INDEPENDENT_AMBULATORY_CARE_PROVIDER_SITE_OTHER): Payer: 59

## 2019-09-15 VITALS — BP 110/64 | HR 80 | Temp 97.4°F | Ht 66.5 in | Wt 136.4 lb

## 2019-09-15 DIAGNOSIS — N950 Postmenopausal bleeding: Secondary | ICD-10-CM | POA: Diagnosis not present

## 2019-09-15 DIAGNOSIS — Z30432 Encounter for removal of intrauterine contraceptive device: Secondary | ICD-10-CM | POA: Diagnosis not present

## 2019-09-15 NOTE — Progress Notes (Signed)
GYNECOLOGY  VISIT   HPI: 54 y.o.   Married  [place]  female   [phone] with Patient's last menstrual period was 08/18/2019 (exact date).   here for pelvic ultrasound and IUD removal. She presented with postmenopausal bleeding and her Mirena IUD is expired. She is postmenopausal.  No more bleeding.   GYNECOLOGIC HISTORY: Patient's last menstrual period was 08/18/2019 (exact date). Contraception: PMP/Mirena IUD 2014-Expired Menopausal hormone therapy:  none Last mammogram:05-20-18 3D/Neg/density C/BiRads1  Last pap smear: 02-04-18 Neg:Neg HR HPV        OB History    Gravida  4   Para  4   Term      Preterm  2   AB      Living  3     SAB      TAB      Ectopic      Multiple      Live Births  3              Patient Active Problem List   Diagnosis Date Noted  . Adhesive capsulitis of left shoulder 11/02/2017  . Anal or rectal pain 11/02/2017  . Irritable bowel syndrome 11/02/2017  . History of BCG vaccination 10/23/2017  . Depression 07/01/2017  . Asthma 07/01/2017  . Left thyroid nodule 01/30/2017  . Prediabetes 08/03/2016    Past Medical History:  Diagnosis Date  . Abnormal uterine bleeding   . Elevated LDL cholesterol level 2020  . Hypercholesterolemia   . Urinary incontinence     Past Surgical History:  Procedure Laterality Date  . APPENDECTOMY  1996    Current Outpatient Medications  Medication Sig Dispense Refill  . chlorhexidine (PERIDEX) 0.12 % solution     . cyclobenzaprine (FLEXERIL) 5 MG tablet Take 1 tablet (5 mg total) by mouth 2 (two) times daily as needed for muscle spasms. 60 tablet 1  . hydrocortisone-pramoxine (PROCTOFOAM-HC) rectal foam Place 1 applicator rectally 2 (two) times daily. 30 g 0  . levonorgestrel (MIRENA) 20 MCG/24HR IUD 1 each by Intrauterine route once.    . nystatin ointment (MYCOSTATIN) Apply to affected area 2 times daily for 7 days and then stop for external vaginal and rectal itching 30 g 0  .  nystatin-triamcinolone (MYCOLOG II) cream Apply 1 application topically 2 (two) times daily. 30 g 5  . sertraline (ZOLOFT) 50 MG tablet Take 50 mg by mouth daily.     No current facility-administered medications for this visit.     ALLERGIES: Patient has no known allergies.  Family History  Problem Relation Age of Onset  . Diabetes Mother   . Cancer Paternal Aunt        cervical  . Cancer Maternal Aunt        colon cancer  . Lung cancer Maternal Grandfather     Social History   Socioeconomic History  . Marital status: Married    Spouse name: Not on file  . Number of children: Not on file  . Years of education: Not on file  . Highest education level: Not on file  Occupational History  . Not on file  Tobacco Use  . Smoking status: Never Smoker  . Smokeless tobacco: Never Used  Substance and Sexual Activity  . Alcohol use: No  . Drug use: No  . Sexual activity: Yes    Partners: Male    Birth control/protection: I.U.D.    Comment: Expired Mirena IUD--place 2014  Other Topics Concern  . Not on file  Social History Narrative  . Not on file   Social Determinants of Health   Financial Resource Strain:   . Difficulty of Paying Living Expenses:   Food Insecurity:   . Worried About Charity fundraiser in the Last Year:   . Arboriculturist in the Last Year:   Transportation Needs:   . Film/video editor (Medical):   Marland Kitchen Lack of Transportation (Non-Medical):   Physical Activity:   . Days of Exercise per Week:   . Minutes of Exercise per Session:   Stress:   . Feeling of Stress :   Social Connections:   . Frequency of Communication with Friends and Family:   . Frequency of Social Gatherings with Friends and Family:   . Attends Religious Services:   . Active Member of Clubs or Organizations:   . Attends Archivist Meetings:   Marland Kitchen Marital Status:   Intimate Partner Violence:   . Fear of Current or Ex-Partner:   . Emotionally Abused:   Marland Kitchen Physically Abused:    . Sexually Abused:     Review of Systems  All other systems reviewed and are negative.   PHYSICAL EXAMINATION:    BP 110/64   Pulse 80   Temp (!) 97.4 F (36.3 C) (Temporal)   Ht 5' 6.5" (1.689 m)   Wt 136 lb 6.4 oz (61.9 kg)   LMP 08/18/2019 (Exact Date)   BMI 21.69 kg/m     General appearance: alert, cooperative and appears stated age   Pelvic: External genitalia:  no lesions              Urethra:  normal appearing urethra with no masses, tenderness or lesions              Bartholins and Skenes: normal                 Vagina: normal appearing vagina with normal color and discharge, no lesions              Cervix: no lesions                Bimanual Exam:  Uterus:  normal size, contour, position, consistency, mobility, non-tender              Adnexa: no mass, fullness, tenderness             Pelvic US Uterus with no myometrial masses.  EMS 2.51 mm.  IUD in normal position in endometrial canal.  Ovaries normal.  No free fluid.  IUD removal.  Consent for procedure.  Ring forceps used to grasp strings and remove IUD which is intact, shown to patient and discarded.   Chaperone was present for exam.  ASSESSMENT  Postmenopausal bleeding.  Reassuring pelvic US.  Expired Mirena IUD.  Removed.   PLAN  Pelvic US images and report reviewed with patient.  Expect mild bleeding and cramping due to IUD removal.  Call for recurrent vaginal bleeding.  Return for annual exam and prn.    An After Visit Summary was printed and given to the patient.

## 2019-10-26 ENCOUNTER — Telehealth: Payer: Self-pay | Admitting: Family Medicine

## 2019-10-26 NOTE — Telephone Encounter (Signed)
Call her and sched appt for shingles vax please

## 2019-10-26 NOTE — Telephone Encounter (Signed)
Called to set up appt, pt said she changed her mind and will call back in a month

## 2019-10-26 NOTE — Telephone Encounter (Unsigned)
Copied from CRM [id number]. Topic: General - Other >> Oct 26, 2019 11:19 AM Autumn Brown wrote: Reason for CRM: Pt called stating that she is needing a shingles vaccine. Please advise.

## 2020-01-09 ENCOUNTER — Other Ambulatory Visit: Payer: Self-pay | Admitting: Obstetrics and Gynecology

## 2020-01-09 DIAGNOSIS — Z1231 Encounter for screening mammogram for malignant neoplasm of breast: Secondary | ICD-10-CM

## 2020-01-17 ENCOUNTER — Ambulatory Visit
Admission: RE | Admit: 2020-01-17 | Discharge: 2020-01-17 | Disposition: A | Discharge status: Home / Self Care | Payer: 59 | Source: Ambulatory Visit | Attending: Obstetrics and Gynecology | Admitting: Obstetrics and Gynecology

## 2020-01-17 DIAGNOSIS — Z1231 Encounter for screening mammogram for malignant neoplasm of breast: Secondary | ICD-10-CM | POA: Diagnosis not present

## 2020-02-10 ENCOUNTER — Telehealth: Payer: Self-pay

## 2020-02-10 NOTE — Telephone Encounter (Signed)
Last MMG 9/21 at [facility] mobile- Birads 1 Neg  OV 9/21 for PMB, IUD removed AEX 04/2018 with DL  Spoke with pt. Pt states wanting to know if needs BMD now or wait. States she didn't have her annual labs done here like last year and is concerned about Vit D and lipids since been at last AEX 04/2018. Pt states PCP does not draw annual labs. Pt states would like labs ordered and to be drawn at [facility].   Pt also states would like to have BMD when needed given her PMP status and age. Pt advised will review with Dr Edward Jolly and return call for recommendations. Pt agreeable.   Routing to Dr Edward Jolly, Please advise on BMD and labs.

## 2020-02-10 NOTE — Telephone Encounter (Signed)
Patient is calling wanting a referral for a bone density screening.

## 2020-02-11 NOTE — Telephone Encounter (Signed)
Please schedule an annual exam with me and we can do blood work and address her bond density request at that time.  Her last annual exam was January, 2020 from what I can see in her chart.

## 2020-02-13 NOTE — Telephone Encounter (Signed)
Spoke with pt. Pt given update per Dr Edward Jolly. Pt states " needs to think about it and will return call to schedule" Pt aware no lab orders placed for Autumn Brown. Pt offered multiple OV and declines at this time.  Routing to Dr Edward Jolly for update Encounter closed

## 2020-03-02 ENCOUNTER — Ambulatory Visit: Payer: Self-pay | Admitting: *Deleted

## 2020-03-02 ENCOUNTER — Telehealth: Payer: Self-pay

## 2020-03-02 NOTE — Telephone Encounter (Signed)
Patient calling to request lab work. Reviewed last message from T. Lynch to A. Hughley with patient. Patient continues to request lab work to be completed with PCP due to she does not want to drive to [facility] if it is not necessary. Patient requesting lipid panel, CBC, vit D, iron levels, calcium levels and Hbg A1C  to be drawn.Patient would like bone density testing to be completed if PCP agrees.  Explained to patient OV would be required to review with PCP if lab work would be ordered. Patient reports she would like to schedule OV and she would come "fasting" in case PCP will order labs to be drawn. appt scheduled for 03/05/20.

## 2020-03-02 NOTE — Telephone Encounter (Signed)
I called and left vm on pt's phone. I explained that we don't run "full blood panel". We can do a cholesterol and a renal panel on her. I also stated that I saw where she was to return to GYN where they would do further "lab work and discuss bone density" with her. Advised to call their office to see what all they were thinking of drawing on her.

## 2020-03-02 NOTE — Telephone Encounter (Signed)
Copied from CRM [id number]. Topic: General - Other >> Mar 02, 2020 11:17 AM Gwenlyn Fudge wrote: Reason for CRM: Pt called and is requesting to have a full panel of lab work done and to know if she should get a bone density test done. Please advise.

## 2020-03-05 ENCOUNTER — Ambulatory Visit: Payer: Self-pay | Admitting: Family Medicine

## 2020-03-08 ENCOUNTER — Ambulatory Visit: Payer: 59 | Admitting: Nurse Practitioner

## 2020-03-12 ENCOUNTER — Ambulatory Visit: Payer: 59 | Admitting: Nurse Practitioner

## 2020-09-01 ENCOUNTER — Encounter: Payer: Self-pay | Admitting: Family Medicine

## 2020-09-07 ENCOUNTER — Encounter: Payer: Self-pay | Admitting: Obstetrics and Gynecology

## 2020-09-07 ENCOUNTER — Ambulatory Visit: Payer: Self-pay | Admitting: Family Medicine

## 2020-09-14 ENCOUNTER — Ambulatory Visit: Payer: 59 | Admitting: Nurse Practitioner

## 2020-09-25 NOTE — Progress Notes (Signed)
55 y.o. G61P0203 Married female here for annual exam.    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 Daughter is now in Administrator school at [employer].  Last year had IUD removed, has not had any bleeding since.   Denies hot flashes or other menopause symptoms. Sometimes has vaginal and anal irritation/itching. Was evaluated 04/2018 and used steroid, helps some but not cured.  Reports over active bladder symptoms, has been going on for years, even before menopause  Requests rx for albuterol, ran out and could not get appointment right away with PCP   Patient's last menstrual period was 08/18/2019 (exact date).            Sexually active: Yes.    The current method of family planning is post menopausal Exercising: Yes.    walking, yoga Smoker:  no  Health Maintenance: Pap:  02-04-18 neg HPV HR neg History of abnormal Pap:  no MMG:  01-17-2020 category c density birads 1:neg Colonoscopy:  Age 9 normal BMD:   none Gardasil:   n/a Covid-19: pfizer Hep C testing: not done Screening Labs:will do next year   reports that she has never smoked. She has never used smokeless tobacco. She reports that she does not drink alcohol and does not use drugs.  Past Medical History:  Diagnosis Date  . Abnormal uterine bleeding   . Elevated LDL cholesterol level 2020  . Hypercholesterolemia   . Urinary incontinence     Past Surgical History:  Procedure Laterality Date  . APPENDECTOMY  1996    No current outpatient medications on file.   No current facility-administered medications for this visit.    Family History  Problem Relation Age of Onset  . Diabetes Mother   . Cancer Paternal Aunt        cervical  . Cancer Maternal Aunt        colon cancer  . Lung cancer Maternal Grandfather     Review of Systems  Constitutional: Negative.   HENT: Negative.   Eyes: Negative.   Respiratory: Negative.   Cardiovascular:  Negative.   Gastrointestinal: Negative.   Endocrine: Negative.   Genitourinary: Negative.   Musculoskeletal: Negative.   Skin: Negative.   Allergic/Immunologic: Negative.   Neurological: Negative.   Hematological: Negative.   Psychiatric/Behavioral: Negative.     Exam:   BP 114/64   Pulse 70   Resp 16   Ht 5' 5.5" (1.664 m)   Wt 143 lb (64.9 kg)   LMP 08/18/2019 (Exact Date)   BMI 23.43 kg/m   Height: 5' 5.5" (166.4 cm)  General appearance: alert, cooperative and appears stated age, no acute distress Head: Normocephalic, without obvious abnormality Neck: no adenopathy, thyroid normal to inspection and palpation Lungs: clear to auscultation bilaterally Breasts: normal appearance, no masses or tenderness, left breast more glandular/fibrocystic than right Heart: regular rate and rhythm Abdomen: soft, non-tender; no masses,  no organomegaly Extremities: extremities normal, no edema Skin: No rashes or lesions Lymph nodes: Cervical, supraclavicular, and axillary nodes normal. No abnormal inguinal nodes palpated Neurologic: Grossly normal   Pelvic: External genitalia:  well circumscribed plaque area ~2cm x 1 cm in crease of groin) thick skin, pink              Urethra:  normal appearing urethra with no masses, tenderness or lesions              Bartholins and Skenes: normal  Vagina: normal appearing vagina, appropriate for age, normal appearing discharge, no lesions              Cervix: neg cervical motion tenderness, no visible lesions             Bimanual Exam:   Uterus:  normal size, contour, position, consistency, mobility, non-tender              Adnexa: no mass, fullness, tenderness   Anus: thickened skin indicative of chronic irritation (lichen simplex chronicus)  Rectal: no palpable mass  Wet mount-WNL  A:  Well woman exam with routine gynecological exam -   Vaginal irritation -Plan: WET PREP FOR TRICH, YEAST, CLUE  Plan: triamcinolone ointment  (KENALOG) 0.1 % (to use externally in groin). Recommend biopsy of that area if does not respond to steroid.   Vaginal atrophy- Vaginal estrogen (vagifem) to use twice weekly  Overactive bladder- discussed kegels, avoidance of bladder irritants (caffeine), may try Oxytrol prn. Pt to discuss further with Dr. Edward Jolly if needed.   P:   Pap :last done 2019, next due in 2024  Mammogram: due 12/2020  Labs:wet mount  Medications: Albuterol inhaler (until has appointment with PCP) estrace vaginal cream, triamcinolone 0.1%

## 2020-09-28 ENCOUNTER — Other Ambulatory Visit: Payer: Self-pay

## 2020-09-28 ENCOUNTER — Encounter: Payer: Self-pay | Admitting: Nurse Practitioner

## 2020-09-28 ENCOUNTER — Other Ambulatory Visit (HOSPITAL_COMMUNITY): Payer: Self-pay

## 2020-09-28 ENCOUNTER — Ambulatory Visit (INDEPENDENT_AMBULATORY_CARE_PROVIDER_SITE_OTHER): Payer: 59 | Admitting: Nurse Practitioner

## 2020-09-28 VITALS — BP 114/64 | HR 70 | Resp 16 | Ht 65.5 in | Wt 143.0 lb

## 2020-09-28 DIAGNOSIS — N898 Other specified noninflammatory disorders of vagina: Secondary | ICD-10-CM

## 2020-09-28 DIAGNOSIS — Z01419 Encounter for gynecological examination (general) (routine) without abnormal findings: Secondary | ICD-10-CM

## 2020-09-28 LAB — WET PREP FOR TRICH, YEAST, CLUE

## 2020-09-28 MED ORDER — TRIAMCINOLONE ACETONIDE 0.1 % EX OINT
TOPICAL_OINTMENT | CUTANEOUS | 1 refills | Status: DC
  Filled 2020-09-28: qty 30, 30d supply, fill #0
  Filled 2021-04-18: qty 30, 14d supply, fill #0

## 2020-09-28 MED ORDER — ESTRADIOL 10 MCG VA TABS
1.0000 | ORAL_TABLET | VAGINAL | 0 refills | Status: DC
  Filled 2021-05-02: qty 18, 63d supply, fill #0
  Filled 2021-07-21: qty 16, 56d supply, fill #1

## 2020-09-28 MED ORDER — ALBUTEROL SULFATE HFA 108 (90 BASE) MCG/ACT IN AERS
2.0000 | INHALATION_SPRAY | Freq: Four times a day (QID) | RESPIRATORY_TRACT | 0 refills | Status: AC | PRN
  Filled 2020-09-28 – 2021-04-18 (×2): qty 8.5, 25d supply, fill #0

## 2020-09-28 NOTE — Patient Instructions (Addendum)
Overactive Bladder, Adult  Overactive bladder is a condition in which a person has a sudden and frequent need to urinate. A person might also leak urine if he or she cannot get to the bathroom fast enough (urinary incontinence). Sometimes, symptoms can interfere with work or social activities. What are the causes? Overactive bladder is associated with poor nerve signals between your bladder and your brain. Your bladder may get the signal to empty before it is full. You may also have very sensitive muscles that make your bladder squeeze too soon. This condition may also be caused by other factors, such as:  Medical conditions: ? Urinary tract infection. ? Infection of nearby tissues. ? Prostate enlargement. ? Bladder stones, inflammation, or tumors. ? Diabetes. ? Muscle or nerve weakness, especially from these conditions:  A spinal cord injury.  Stroke.  Multiple sclerosis.  Parkinson's disease.  Other causes: ? Surgery on the uterus or urethra. ? Drinking too much caffeine or alcohol. ? Certain medicines, especially those that eliminate extra fluid in the body (diuretics). ? Constipation. What increases the risk? You may be at greater risk for overactive bladder if you:  Are an older adult.  Smoke.  Are going through menopause.  Have prostate problems.  Have a neurological disease, such as stroke, dementia, Parkinson's disease, or multiple sclerosis (MS).  Eat or drink alcohol, spicy food, caffeine, and other things that irritate the bladder.  Are overweight or obese. What are the signs or symptoms? Symptoms of this condition include a sudden, strong urge to urinate. Other symptoms include:  Leaking urine.  Urinating 8 or more times a day.  Waking up to urinate 2 or more times overnight. How is this diagnosed? This condition may be diagnosed based on:  Your symptoms and medical history.  A physical exam.  Blood or urine tests to check for possible causes,  such as infection. You may also need to see a health care provider who specializes in urinary tract problems. This is called a urologist. How is this treated? Treatment for overactive bladder depends on the cause of your condition and whether it is mild or severe. Treatment may include:  Bladder training, such as: ? Learning to control the urge to urinate by following a schedule to urinate at regular intervals. ? Doing Kegel exercises to strengthen the pelvic floor muscles that support your bladder.  Special devices, such as: ? Biofeedback. This uses sensors to help you become aware of your body's signals. ? Electrical stimulation. This uses electrodes placed inside the body (implanted) or outside the body. These electrodes send gentle pulses of electricity to strengthen the nerves or muscles that control the bladder. ? Women may use a plastic device, called a pessary, that fits into the vagina and supports the bladder.  Medicines, such as: ? Antibiotics to treat bladder infection. ? Antispasmodics to stop the bladder from releasing urine at the wrong time. ? Tricyclic antidepressants to relax bladder muscles. ? Injections of botulinum toxin type A directly into the bladder tissue to relax bladder muscles.  Surgery, such as: ? A device may be implanted to help manage the nerve signals that control urination. ? An electrode may be implanted to stimulate electrical signals in the bladder. ? A procedure may be done to change the shape of the bladder. This is done only in very severe cases. Follow these instructions at home: Eating and drinking  Make diet or lifestyle changes recommended by your health care provider. These may include: ? Drinking fluids   throughout the day and not only with meals. ? Cutting down on caffeine or alcohol. ? Eating a healthy and balanced diet to prevent constipation. This may include:  Choosing foods that are high in fiber, such as beans, whole grains, and  fresh fruits and vegetables.  Limiting foods that are high in fat and processed sugars, such as fried and sweet foods.   Lifestyle  Lose weight if needed.  Do not use any products that contain nicotine or tobacco. These include cigarettes, chewing tobacco, and vaping devices, such as e-cigarettes. If you need help quitting, ask your health care provider.   General instructions  Take over-the-counter and prescription medicines only as told by your health care provider.  If you were prescribed an antibiotic medicine, take it as told by your health care provider. Do not stop taking the antibiotic even if you start to feel better.  Use any implants or pessary as told by your health care provider.  If needed, wear pads to absorb urine leakage.  Keep a log to track how much and when you drink, and when you need to urinate. This will help your health care provider monitor your condition.  Keep all follow-up visits. This is important. Contact a health care provider if:  You have a fever or chills.  Your symptoms do not get better with treatment.  Your pain and discomfort get worse.  You have more frequent urges to urinate. Get help right away if:  You are not able to control your bladder. Summary  Overactive bladder refers to a condition in which a person has a sudden and frequent need to urinate.  Several conditions may lead to an overactive bladder.  Treatment for overactive bladder depends on the cause and severity of your condition.  Making lifestyle changes, doing Kegel exercises, keeping a log, and taking medicines can help with this condition. This information is not intended to replace advice given to you by your health care provider. Make sure you discuss any questions you have with your health care provider. Document Revised: 01/02/2020 Document Reviewed: 01/02/2020 Elsevier Patient Education  2021 [employer] Inc[employer]   Health Maintenance, Female Adopting a healthy  lifestyle and getting preventive care are important in promoting health and wellness. Ask your health care provider about:  The right schedule for you to have regular tests and exams.  Things you can do on your own to prevent diseases and keep yourself healthy. What should I know about diet, weight, and exercise? Eat a healthy diet  Eat a diet that includes plenty of vegetables, fruits, low-fat dairy products, and lean protein.  Do not eat a lot of foods that are high in solid fats, added sugars, or sodium.   Maintain a healthy weight Body mass index (BMI) is used to identify weight problems. It estimates body fat based on height and weight. Your health care provider can help determine your BMI and help you achieve or maintain a healthy weight. Get regular exercise Get regular exercise. This is one of the most important things you can do for your health. Most adults should:  Exercise for at least 150 minutes each week. The exercise should increase your heart rate and make you sweat (moderate-intensity exercise).  Do strengthening exercises at least twice a week. This is in addition to the moderate-intensity exercise.  Spend less time sitting. Even light physical activity can be beneficial. Watch cholesterol and blood lipids Have your blood tested for lipids and cholesterol at 56 years of age,  then have this test every 5 years. Have your cholesterol levels checked more often if:  Your lipid or cholesterol levels are high.  You are older than 57 years of age.  You are at high risk for heart disease. What should I know about cancer screening? Depending on your health history and family history, you may need to have cancer screening at various ages. This may include screening for:  Breast cancer.  Cervical cancer.  Colorectal cancer.  Skin cancer.  Lung cancer. What should I know about heart disease, diabetes, and high blood pressure? Blood pressure and heart disease  High  blood pressure causes heart disease and increases the risk of stroke. This is more likely to develop in people who have high blood pressure readings, are of African descent, or are overweight.  Have your blood pressure checked: ? Every 3-5 years if you are 25-28 years of age. ? Every year if you are 32 years old or older. Diabetes Have regular diabetes screenings. This checks your fasting blood sugar level. Have the screening done:  Once every three years after age 21 if you are at a normal weight and have a low risk for diabetes.  More often and at a younger age if you are overweight or have a high risk for diabetes. What should I know about preventing infection? Hepatitis B If you have a higher risk for hepatitis B, you should be screened for this virus. Talk with your health care provider to find out if you are at risk for hepatitis B infection. Hepatitis C Testing is recommended for:  Everyone born from [birth year] through 1966.  Anyone with known risk factors for hepatitis C. Sexually transmitted infections (STIs)  Get screened for STIs, including gonorrhea and chlamydia, if: ? You are sexually active and are younger than 56 years of age. ? You are older than 56 years of age and your health care provider tells you that you are at risk for this type of infection. ? Your sexual activity has changed since you were last screened, and you are at increased risk for chlamydia or gonorrhea. Ask your health care provider if you are at risk.  Ask your health care provider about whether you are at high risk for HIV. Your health care provider may recommend a prescription medicine to help prevent HIV infection. If you choose to take medicine to prevent HIV, you should first get tested for HIV. You should then be tested every 3 months for as long as you are taking the medicine. Pregnancy  If you are about to stop having your period (premenopausal) and you may become pregnant, seek counseling before you  get pregnant.  Take 400 to 800 micrograms (mcg) of folic acid every day if you become pregnant.  Ask for birth control (contraception) if you want to prevent pregnancy. Osteoporosis and menopause Osteoporosis is a disease in which the bones lose minerals and strength with aging. This can result in bone fractures. If you are 54 years old or older, or if you are at risk for osteoporosis and fractures, ask your health care provider if you should:  Be screened for bone loss.  Take a calcium or vitamin D supplement to lower your risk of fractures.  Be given hormone replacement therapy (HRT) to treat symptoms of menopause. Follow these instructions at home: Lifestyle  Do not use any products that contain nicotine or tobacco, such as cigarettes, e-cigarettes, and chewing tobacco. If you need help quitting, ask your health care  provider.  Do not use street drugs.  Do not share needles.  Ask your health care provider for help if you need support or information about quitting drugs. Alcohol use  Do not drink alcohol if: ? Your health care provider tells you not to drink. ? You are pregnant, may be pregnant, or are planning to become pregnant.  If you drink alcohol: ? Limit how much you use to 0-1 drink a day. ? Limit intake if you are breastfeeding.  Be aware of how much alcohol is in your drink. In the [place] one drink equals one 12 oz bottle of beer (355 mL), one 5 oz glass of wine (148 mL), or one 1 oz glass of hard liquor (44 mL). General instructions  Schedule regular health, dental, and eye exams.  Stay current with your vaccines.  Tell your health care provider if: ? You often feel depressed. ? You have ever been abused or do not feel safe at home. Summary  Adopting a healthy lifestyle and getting preventive care are important in promoting health and wellness.  Follow your health care provider's instructions about healthy diet, exercising, and getting tested or screened  for diseases.  Follow your health care provider's instructions on monitoring your cholesterol and blood pressure. This information is not intended to replace advice given to you by your health care provider. Make sure you discuss any questions you have with your health care provider. Document Revised: 04/07/2018 Document Reviewed: 04/07/2018 Elsevier Patient Education  2021 [employer].

## 2020-10-08 ENCOUNTER — Other Ambulatory Visit (HOSPITAL_COMMUNITY): Payer: Self-pay

## 2021-04-15 ENCOUNTER — Other Ambulatory Visit: Payer: Self-pay | Admitting: Obstetrics and Gynecology

## 2021-04-15 DIAGNOSIS — Z1231 Encounter for screening mammogram for malignant neoplasm of breast: Secondary | ICD-10-CM

## 2021-04-18 ENCOUNTER — Other Ambulatory Visit: Payer: Self-pay

## 2021-04-18 MED ORDER — CARESTART COVID-19 HOME TEST VI KIT
PACK | 0 refills | Status: DC
  Filled 2021-04-18: qty 2, 4d supply, fill #0

## 2021-05-02 ENCOUNTER — Ambulatory Visit (INDEPENDENT_AMBULATORY_CARE_PROVIDER_SITE_OTHER): Payer: No Typology Code available for payment source | Admitting: Family Medicine

## 2021-05-02 ENCOUNTER — Other Ambulatory Visit: Payer: Self-pay

## 2021-05-02 ENCOUNTER — Encounter: Payer: Self-pay | Admitting: Family Medicine

## 2021-05-02 VITALS — BP 122/62 | HR 60 | Ht 65.5 in | Wt 134.0 lb

## 2021-05-02 DIAGNOSIS — L2084 Intrinsic (allergic) eczema: Secondary | ICD-10-CM | POA: Diagnosis not present

## 2021-05-02 DIAGNOSIS — F329 Major depressive disorder, single episode, unspecified: Secondary | ICD-10-CM | POA: Diagnosis not present

## 2021-05-02 MED ORDER — TRIAMCINOLONE ACETONIDE 0.1 % EX CREA
1.0000 "application " | TOPICAL_CREAM | Freq: Two times a day (BID) | CUTANEOUS | 3 refills | Status: DC
  Filled 2021-05-02: qty 30, 10d supply, fill #0
  Filled 2021-07-21: qty 30, 10d supply, fill #1
  Filled 2022-03-09: qty 30, 10d supply, fill #2

## 2021-05-02 MED ORDER — FLUOXETINE HCL 20 MG PO CAPS
20.0000 mg | ORAL_CAPSULE | Freq: Every day | ORAL | 1 refills | Status: DC
  Filled 2021-05-02 (×2): qty 90, 90d supply, fill #0
  Filled 2021-07-21: qty 90, 90d supply, fill #1

## 2021-05-02 MED ORDER — LORATADINE 10 MG PO TABS
10.0000 mg | ORAL_TABLET | Freq: Every day | ORAL | 3 refills | Status: DC
  Filled 2021-05-02 – 2022-03-09 (×2): qty 90, 90d supply, fill #0

## 2021-05-02 NOTE — Progress Notes (Signed)
Date:  05/02/2021   Name:  Autumn Brown   DOB:  November 19, 1965   MRN:  [id number]   Chief Complaint: Depression (Taking prozac 20 mg  prescribed from [place] in 12/10/2020)  Depression        This is a chronic problem.  The current episode started more than 1 year ago.   The onset quality is gradual.   The problem occurs daily.  The problem has been gradually improving since onset.  Associated symptoms include insomnia, decreased interest and sad.  Associated symptoms include no suicidal ideas.  Past treatments include SSRIs - Selective serotonin reuptake inhibitors (Previous on sertraline but was switched by previous provider to Prozac and is currently on 20 mg once a day.).  Compliance with treatment is good.  Previous treatment provided moderate relief.  Lab Results  Component Value Date   NA 142 05/11/2018   K 3.8 05/11/2018   CO2 23 05/11/2018   GLUCOSE 93 05/11/2018   BUN 13 05/11/2018   CREATININE 0.56 (L) 05/11/2018   CALCIUM 9.2 05/11/2018   GFRNONAA 107 05/11/2018   Lab Results  Component Value Date   CHOL 203 (H) 05/11/2018   HDL 40 05/11/2018   LDLCALC 140 (H) 05/11/2018   TRIG 117 05/11/2018   CHOLHDL 5.1 (H) 05/11/2018   Lab Results  Component Value Date   TSH 1.680 05/11/2018   No results found for: HGBA1C Lab Results  Component Value Date   WBC 6.4 05/11/2018   HGB 12.6 05/11/2018   HCT 37.7 05/11/2018   MCV 87 05/11/2018   PLT 171 05/11/2018   Lab Results  Component Value Date   ALT 17 05/11/2018   AST 14 05/11/2018   ALKPHOS 55 05/11/2018   BILITOT 0.3 05/11/2018   Lab Results  Component Value Date   VD25OH 35.1 05/11/2018     Review of Systems  Psychiatric/Behavioral:  Positive for depression. Negative for suicidal ideas. The patient has insomnia.    Patient Active Problem List   Diagnosis Date Noted   Adhesive capsulitis of left shoulder 11/02/2017   Anal or rectal pain 11/02/2017   Irritable bowel syndrome 11/02/2017   History of BCG  vaccination 10/23/2017   Depression 07/01/2017   Asthma 07/01/2017   Left thyroid nodule 01/30/2017   Prediabetes 08/03/2016    No Known Allergies  Past Surgical History:  Procedure Laterality Date   APPENDECTOMY  1996    Social History   Tobacco Use   Smoking status: Never   Smokeless tobacco: Never  Vaping Use   Vaping Use: Never used  Substance Use Topics   Alcohol use: No   Drug use: No     Medication list has been reviewed and updated.  Current Meds  Medication Sig   albuterol (VENTOLIN HFA) 108 (90 Base) MCG/ACT inhaler Inhale 2 puffs into the lungs every 6 (six) hours as needed for wheezing or shortness of breath.   Estradiol (VAGIFEM) 10 MCG TABS vaginal tablet Place 1 tablet (10 mcg total) vaginally 2 (two) times a week.   FLUoxetine (PROZAC) 20 MG capsule Take 1 capsule (20 mg total) by mouth daily.   loratadine (CLARITIN) 10 MG tablet Take 1 tablet (10 mg total) by mouth daily.   triamcinolone cream (KENALOG) 0.1 % Apply 1 application topically 2 (two) times daily.   triamcinolone ointment (KENALOG) 0.1 % Apply a small amount twice daily for 14 days,  then use twice weekly as directed   [DISCONTINUED] COVID-19 At Home Antigen  Test [facility] COVID-19 HOME TEST) KIT use as directed    [facility] 2/9 Scores 05/02/2021 02/17/2019 01/11/2018 12/18/2017  PHQ - 2 Score 0 0 0 0  PHQ- 9 Score 0 0 0 1    GAD 7 : Generalized Anxiety Score 05/02/2021  Nervous, Anxious, on Edge 0  Control/stop worrying 0  Worry too much - different things 0  Trouble relaxing 0  Restless 0  Easily annoyed or irritable 0  Afraid - awful might happen 0  Total GAD 7 Score 0  Anxiety Difficulty Not difficult at all    BP Readings from Last 3 Encounters:  05/02/21 122/62  09/28/20 114/64  09/15/19 110/64    Physical Exam  Wt Readings from Last 3 Encounters:  05/02/21 134 lb (60.8 kg)  09/28/20 143 lb (64.9 kg)  09/15/19 136 lb 6.4 oz (61.9 kg)    BP 122/62    Pulse 60    Ht 5' 5.5"  (1.664 m)    Wt 134 lb (60.8 kg)    LMP 08/18/2019 (Exact Date)    BMI 21.96 kg/m   Assessment and Plan:  1. Reactive depression  Chronic.  Persistent.  Stable.  Currently taking fluoxetine 20 mg once a day.  We will continue patient's medication at current dosing.  And will recheck in 6 months. - FLUoxetine (PROZAC) 20 MG capsule; Take 1 capsule (20 mg total) by mouth daily.  Dispense: 90 capsule; Refill: 1  2. Intrinsic atopic dermatitis Patient relates having a rash which sounds like it may be atopic dermatitis.  I have suggested that she take Claritin if she would like that called in as a prescription in which we will do so 10 mg once a day.  Patient also requested refill on her cream which I am assuming there is triamcinolone even though she was on ointment from GYN but I will prescribe the cream for topical skin treatment of rash on a as needed basis. - loratadine (CLARITIN) 10 MG tablet; Take 1 tablet (10 mg total) by mouth daily.  Dispense: 90 tablet; Refill: 3 - triamcinolone cream (KENALOG) 0.1 %; Apply 1 application topically 2 (two) times daily.  Dispense: 30 g; Refill: 3   Patient brought up a concern that she was having paleness of her upper extremities when elevated above her head.  She has appointment already with Dr. Nehemiah Massed scheduled and I have indicated her to bring this up with him at the time to determine if there is a vascular concern of the brachial arteries, or aortic or something of a subclavian steal concern.  Patient states that she will be calling to schedule that appointment and we have encouraged her to do so.

## 2021-05-03 ENCOUNTER — Other Ambulatory Visit: Payer: Self-pay

## 2021-05-07 ENCOUNTER — Other Ambulatory Visit: Payer: Self-pay

## 2021-05-21 ENCOUNTER — Ambulatory Visit
Admission: RE | Admit: 2021-05-21 | Discharge: 2021-05-21 | Disposition: A | Discharge status: Home / Self Care | Payer: No Typology Code available for payment source | Source: Ambulatory Visit | Attending: Obstetrics and Gynecology | Admitting: Obstetrics and Gynecology

## 2021-05-21 ENCOUNTER — Other Ambulatory Visit: Payer: Self-pay

## 2021-05-21 DIAGNOSIS — Z1231 Encounter for screening mammogram for malignant neoplasm of breast: Secondary | ICD-10-CM | POA: Diagnosis present

## 2021-05-22 ENCOUNTER — Other Ambulatory Visit: Payer: Self-pay | Admitting: Obstetrics and Gynecology

## 2021-05-22 DIAGNOSIS — R928 Other abnormal and inconclusive findings on diagnostic imaging of breast: Secondary | ICD-10-CM

## 2021-05-22 DIAGNOSIS — N6489 Other specified disorders of breast: Secondary | ICD-10-CM

## 2021-05-27 ENCOUNTER — Telehealth: Payer: Self-pay | Admitting: Family Medicine

## 2021-06-04 ENCOUNTER — Other Ambulatory Visit: Payer: Self-pay

## 2021-06-04 MED ORDER — ESTRADIOL 0.05 MG/24HR TD PTTW
MEDICATED_PATCH | TRANSDERMAL | 0 refills | Status: DC
  Filled 2021-06-04: qty 24, 84d supply, fill #0

## 2021-06-04 MED ORDER — PROGESTERONE MICRONIZED 100 MG PO CAPS
ORAL_CAPSULE | ORAL | 0 refills | Status: AC
  Filled 2021-06-04: qty 90, 90d supply, fill #0

## 2021-06-06 ENCOUNTER — Other Ambulatory Visit: Payer: Self-pay

## 2021-06-07 ENCOUNTER — Ambulatory Visit
Admission: RE | Admit: 2021-06-07 | Discharge: 2021-06-07 | Disposition: A | Discharge status: Home / Self Care | Payer: No Typology Code available for payment source | Source: Ambulatory Visit | Attending: Obstetrics and Gynecology | Admitting: Obstetrics and Gynecology

## 2021-06-07 ENCOUNTER — Other Ambulatory Visit: Payer: Self-pay | Admitting: Family Medicine

## 2021-06-07 ENCOUNTER — Other Ambulatory Visit: Payer: Self-pay

## 2021-06-07 DIAGNOSIS — R928 Other abnormal and inconclusive findings on diagnostic imaging of breast: Secondary | ICD-10-CM | POA: Insufficient documentation

## 2021-06-07 DIAGNOSIS — N6489 Other specified disorders of breast: Secondary | ICD-10-CM

## 2021-06-07 DIAGNOSIS — N6320 Unspecified lump in the left breast, unspecified quadrant: Secondary | ICD-10-CM

## 2021-06-11 ENCOUNTER — Ambulatory Visit: Payer: No Typology Code available for payment source

## 2021-06-11 ENCOUNTER — Telehealth: Payer: Self-pay

## 2021-06-11 ENCOUNTER — Other Ambulatory Visit: Payer: No Typology Code available for payment source

## 2021-06-11 NOTE — Telephone Encounter (Signed)
Copied from CRM [id number]. Topic: General - Other >> Jun 11, 2021 11:36 AM Gaetana Michaelis A wrote: Reason for CRM: the patient has called to request orders for a right upper quadrant ultrasound   Please contact further when possible

## 2021-06-14 ENCOUNTER — Encounter: Payer: Self-pay | Admitting: Family Medicine

## 2021-06-14 ENCOUNTER — Other Ambulatory Visit: Payer: Self-pay

## 2021-06-14 ENCOUNTER — Ambulatory Visit (INDEPENDENT_AMBULATORY_CARE_PROVIDER_SITE_OTHER): Payer: No Typology Code available for payment source | Admitting: Family Medicine

## 2021-06-14 VITALS — BP 120/60 | HR 76 | Ht 65.5 in | Wt 135.0 lb

## 2021-06-14 DIAGNOSIS — R93429 Abnormal radiologic findings on diagnostic imaging of unspecified kidney: Secondary | ICD-10-CM | POA: Diagnosis not present

## 2021-06-14 DIAGNOSIS — K824 Cholesterolosis of gallbladder: Secondary | ICD-10-CM | POA: Diagnosis not present

## 2021-06-14 DIAGNOSIS — R1011 Right upper quadrant pain: Secondary | ICD-10-CM | POA: Diagnosis not present

## 2021-06-14 NOTE — Progress Notes (Signed)
Date:  06/14/2021   Name:  Autumn Brown   DOB:  [birth year]   MRN:  [id number]   Chief Complaint: Abdominal Pain (RUQ pain x 1 year- 3/10 constant pain. Tylenol and Ibuprofen seem to help. )  Patient is a 56 year old female who presents for a right upper quadrant and midepigastric discomfort exam. The patient reports the following problems: Gallbladder polyps. Health maintenance has been reviewed up-to-date    Abdominal Pain This is a recurrent problem. The current episode started more than 1 year ago. The onset quality is gradual. The problem occurs constantly. The problem has been waxing and waning. The pain is located in the RUQ. The pain is moderate. Quality: blunt. The abdominal pain radiates to the back and right flank. Associated symptoms include weight loss. Pertinent negatives include no constipation, diarrhea, fever, hematochezia, melena, nausea or vomiting. Relieved by: not eating.   Lab Results  Component Value Date   NA 142 05/11/2018   K 3.8 05/11/2018   CO2 23 05/11/2018   GLUCOSE 93 05/11/2018   BUN 13 05/11/2018   CREATININE 0.56 (L) 05/11/2018   CALCIUM 9.2 05/11/2018   GFRNONAA 107 05/11/2018   Lab Results  Component Value Date   CHOL 203 (H) 05/11/2018   HDL 40 05/11/2018   LDLCALC 140 (H) 05/11/2018   TRIG 117 05/11/2018   CHOLHDL 5.1 (H) 05/11/2018   Lab Results  Component Value Date   TSH 1.680 05/11/2018   No results found for: HGBA1C Lab Results  Component Value Date   WBC 6.4 05/11/2018   HGB 12.6 05/11/2018   HCT 37.7 05/11/2018   MCV 87 05/11/2018   PLT 171 05/11/2018   Lab Results  Component Value Date   ALT 17 05/11/2018   AST 14 05/11/2018   ALKPHOS 55 05/11/2018   BILITOT 0.3 05/11/2018   Lab Results  Component Value Date   VD25OH 35.1 05/11/2018     Review of Systems  Constitutional:  Positive for weight loss. Negative for fever.  Gastrointestinal:  Positive for abdominal pain. Negative for abdominal distention, blood in  stool, constipation, diarrhea, hematochezia, melena, nausea and vomiting.   Patient Active Problem List   Diagnosis Date Noted   Adhesive capsulitis of left shoulder 11/02/2017   Anal or rectal pain 11/02/2017   Irritable bowel syndrome 11/02/2017   History of BCG vaccination 10/23/2017   Depression 07/01/2017   Asthma 07/01/2017   Left thyroid nodule 01/30/2017   Prediabetes 08/03/2016    No Known Allergies  Past Surgical History:  Procedure Laterality Date   APPENDECTOMY  1996    Social History   Tobacco Use   Smoking status: Never   Smokeless tobacco: Never  Vaping Use   Vaping Use: Never used  Substance Use Topics   Alcohol use: No   Drug use: No     Medication list has been reviewed and updated.  Current Meds  Medication Sig   albuterol (VENTOLIN HFA) 108 (90 Base) MCG/ACT inhaler Inhale 2 puffs into the lungs every 6 (six) hours as needed for wheezing or shortness of breath.   estradiol (VIVELLE-DOT) 0.05 MG/24HR patch Apply 1 patch twice a week by transdermal route.   Estradiol (YUVAFEM) 10 MCG TABS vaginal tablet Place 1 tablet (10 mcg total) vaginally 2 (two) times a week.   FLUoxetine (PROZAC) 20 MG capsule Take 1 capsule (20 mg total) by mouth daily.   loratadine (CLARITIN) 10 MG tablet Take 1 tablet (10 mg total) by  mouth daily.   progesterone (PROMETRIUM) 100 MG capsule Take 1 capsule every day by oral route.   triamcinolone cream (KENALOG) 0.1 % Apply 1 application topically 2 (two) times daily.   triamcinolone ointment (KENALOG) 0.1 % Apply a small amount twice daily for 14 days,  then use twice weekly as directed    [facility] 2/9 Scores 05/02/2021 02/17/2019 01/11/2018 12/18/2017  PHQ - 2 Score 0 0 0 0  PHQ- 9 Score 0 0 0 1    GAD 7 : Generalized Anxiety Score 05/02/2021  Nervous, Anxious, on Edge 0  Control/stop worrying 0  Worry too much - different things 0  Trouble relaxing 0  Restless 0  Easily annoyed or irritable 0  Afraid - awful might happen 0   Total GAD 7 Score 0  Anxiety Difficulty Not difficult at all    BP Readings from Last 3 Encounters:  06/14/21 120/60  05/02/21 122/62  09/28/20 114/64    Physical Exam Constitutional:      Appearance: She is well-developed.  Cardiovascular:     Heart sounds: S1 normal and S2 normal.  No systolic murmur is present.  No diastolic murmur is present.    No S3 or S4 sounds.  Pulmonary:     Breath sounds: Normal breath sounds. No decreased breath sounds, wheezing, rhonchi or rales.  Abdominal:     General: Bowel sounds are normal.     Palpations: Abdomen is soft. There is hepatomegaly. There is no splenomegaly.     Tenderness: There is abdominal tenderness in the right upper quadrant and epigastric area. There is no guarding.  Neurological:     Mental Status: She is alert.    Wt Readings from Last 3 Encounters:  06/14/21 135 lb (61.2 kg)  05/02/21 134 lb (60.8 kg)  09/28/20 143 lb (64.9 kg)    BP 120/60    Pulse 76    Ht 5' 5.5" (1.664 m)    Wt 135 lb (61.2 kg)    LMP 08/18/2019 (Exact Date)    BMI 22.12 kg/m   Assessment and Plan:  1. RUQ pain Chronic.  Persistent.  Waxing and waning in intensity.  Pain in the right upper quadrant and epigastric area.  There is tenderness noted over these but no liver edge or splenomegaly.  We will proceed with further evaluation of repeat ultrasound and that its been at least 4 years. - US Abdomen Limited RUQ (LIVER/GB); Future  2. Gallbladder polyp As noted on the ultrasound at [facility] there were 2 polyps seen in the gallbladder and we are reevaluating these to see if there is been any progression or or increase in size or numbers. - US Abdomen Limited RUQ (LIVER/GB); Future  3. Abnormal renal ultrasound Noted also was a hyper acholic lesion in the right kidney and while we were in the process of evaluating the gallbladder we will also evaluate the kidney to see if there is been any progression of this process. - US Renal; Future   We  are also likely to refer to either gastroenterology or general surgery and patient is in the process of giving [place] the name and location of her next step of evaluation.

## 2021-06-21 ENCOUNTER — Encounter: Payer: Self-pay | Admitting: Family Medicine

## 2021-06-21 ENCOUNTER — Other Ambulatory Visit: Payer: Self-pay

## 2021-06-21 ENCOUNTER — Ambulatory Visit: Payer: No Typology Code available for payment source

## 2021-06-21 ENCOUNTER — Ambulatory Visit
Admission: RE | Admit: 2021-06-21 | Discharge: 2021-06-21 | Disposition: A | Discharge status: Home / Self Care | Payer: No Typology Code available for payment source | Source: Ambulatory Visit | Attending: Family Medicine | Admitting: Family Medicine

## 2021-06-21 DIAGNOSIS — K824 Cholesterolosis of gallbladder: Secondary | ICD-10-CM | POA: Diagnosis present

## 2021-06-21 DIAGNOSIS — R1011 Right upper quadrant pain: Secondary | ICD-10-CM | POA: Insufficient documentation

## 2021-06-25 ENCOUNTER — Other Ambulatory Visit: Payer: Self-pay

## 2021-06-25 ENCOUNTER — Telehealth: Payer: Self-pay

## 2021-06-25 DIAGNOSIS — K760 Fatty (change of) liver, not elsewhere classified: Secondary | ICD-10-CM

## 2021-06-25 DIAGNOSIS — K824 Cholesterolosis of gallbladder: Secondary | ICD-10-CM

## 2021-06-25 NOTE — Progress Notes (Signed)
Ref placed for surg and GI

## 2021-06-25 NOTE — Telephone Encounter (Signed)
Scheduled for 08/20/2021 °

## 2021-07-02 ENCOUNTER — Ambulatory Visit: Payer: No Typology Code available for payment source

## 2021-07-12 ENCOUNTER — Ambulatory Visit: Payer: No Typology Code available for payment source | Admitting: Surgery

## 2021-07-12 ENCOUNTER — Encounter: Payer: Self-pay | Admitting: Surgery

## 2021-07-12 ENCOUNTER — Other Ambulatory Visit: Payer: Self-pay

## 2021-07-12 VITALS — BP 95/60 | HR 72 | Temp 98.3°F | Ht 65.5 in | Wt 134.0 lb

## 2021-07-12 DIAGNOSIS — K824 Cholesterolosis of gallbladder: Secondary | ICD-10-CM

## 2021-07-12 DIAGNOSIS — K8020 Calculus of gallbladder without cholecystitis without obstruction: Secondary | ICD-10-CM

## 2021-07-12 NOTE — Progress Notes (Signed)
?07/12/2021 ? ?Reason for Visit:  Gallbladder polyp and cholelithiasis ? ?Requesting Provider:  Elizabeth Sauer, MD ? ?History of Present Illness: ?Autumn Brown is a 56 y.o. female presenting for evaluation of gallbladder polyps and cholelithiasis.  The patient reports that she has a known history of gallbladder polyps had an ultrasound done in October 2018 showing 2 nonmobile lesions measuring 0.8 x 2.1 x 0.6 cm and 0.5 x 0.9 x 0.4 cm.  There is no gallstones noted on that ultrasound.  The patient had a repeat ultrasound on 06/17/2021 which also did not show any cholelithiasis but showed what appeared to be several nonmobile polyps throughout the gallbladder with the largest measuring 1.3 x 0.5 x 1.1 cm.  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 ? ?The patient reports that initially she was not having any symptoms from the gallbladder standpoint but over the last 3 months she has noticed more pain in the right upper quadrant which is intermittent in nature.  The pain is more commonly found in the mornings when she wakes up.  The pain does radiate to the back and the right flank.  No associated nausea or vomiting.  The patient also has other areas of abdominal pain that she has had chronically and she has an appointment with Dr. Allegra Lai on 08/20/2021 for further evaluation of this. ? ?Past Medical History: ?Past Medical History:  ?Diagnosis Date  ? Abnormal uterine bleeding   ? Elevated LDL cholesterol level 2020  ? Hypercholesterolemia   ? Left bundle branch block   ? Urinary incontinence   ?  ? ?Past Surgical History: ?Past Surgical History:  ?Procedure Laterality Date  ? APPENDECTOMY  1996  ? ? ?Home Medications: ?Prior to Admission medications   ?Medication Sig Start Date End Date Taking? Authorizing Provider   ?albuterol (VENTOLIN HFA) 108 (90 Base) MCG/ACT inhaler Inhale 2 puffs into the lungs every 6 (six) hours as needed for wheezing or shortness of breath. 09/28/20  Yes Clarita Crane, NP  ?estradiol (VIVELLE-DOT) 0.05 MG/24HR patch Apply 1 patch twice a week by transdermal route. 06/04/21  Yes   ?Estradiol (YUVAFEM) 10 MCG TABS vaginal tablet Place 1 tablet (10 mcg total) vaginally 2 (two) times a week. 10/01/20  Yes Clarita Crane, NP  ?FLUoxetine (PROZAC) 20 MG capsule Take 1 capsule (20 mg total) by mouth daily. 05/02/21  Yes Duanne Limerick, MD  ?loratadine (CLARITIN) 10 MG tablet Take 1 tablet (10 mg total) by mouth daily. 05/02/21  Yes Duanne Limerick, MD  ?progesterone (PROMETRIUM) 100 MG capsule Take 1 capsule every day by oral route. 06/04/21  Yes   ?triamcinolone cream (KENALOG) 0.1 % Apply 1 application topically 2 (two) times daily. 05/02/21  Yes Duanne Limerick, MD  ?triamcinolone ointment (KENALOG) 0.1 % Apply a small amount twice daily for 14 days,  then use twice weekly as directed 09/28/20  Yes Clarita Crane, NP  ? ? ?Allergies: ?Allergies  ?Allergen Reactions  ? Pork-Derived Products   ?  Against religion to eat  ? ? ?Social History: ? reports that she has never smoked. She has never been exposed to tobacco smoke. She has never used smokeless tobacco. She reports that she does not drink alcohol and does not use drugs.  ? ?Family History: ?Family History  ?  Problem Relation Age of Onset  ? Diabetes Mother   ? Cancer Maternal Aunt   ?     colon cancer  ? Cancer Paternal Aunt   ?     cervical  ? Lung cancer Maternal Grandfather   ? Breast cancer Neg Hx   ? ? ?Review of Systems: ?Review of Systems  ?Constitutional:  Negative for chills and fever.  ?HENT:  Negative for hearing loss.   ?Respiratory:  Negative for shortness of breath.   ?Cardiovascular:  Negative for chest pain.  ?Gastrointestinal:  Positive for abdominal pain. Negative for constipation, diarrhea, nausea and vomiting.  ?Genitourinary:  Negative for dysuria.   ?Musculoskeletal:  Negative for myalgias.  ?Skin:  Negative for rash.  ?Neurological:  Negative for dizziness.  ?Psychiatric/Behavioral:  Negative for depression.   ? ?Physical Exam ?BP 95/60   Pulse 72   Temp 98.3 ?F (36.8 ?C)   Ht 5' 5.5" (1.664 m)   Wt 134 lb (60.8 kg)   LMP 08/18/2019 (Exact Date)   SpO2 98%   BMI 21.96 kg/m?  ?CONSTITUTIONAL: No acute distress, well-nourished ?HEENT:  Normocephalic, atraumatic, extraocular motion intact. ?NECK: Trachea is midline, and there is no jugular venous distension.  ?RESPIRATORY:  Lungs are clear, and breath sounds are equal bilaterally. Normal respiratory effort without pathologic use of accessory muscles. ?CARDIOVASCULAR: Heart is regular without murmurs, gallops, or rubs. ?GI: The abdomen is soft, nondistended, currently nontender to palpation.  Negative Murphy's sign.  The patient has a well-healed open appendectomy scar in the right lower quadrant and no umbilical hernia.  ?MUSCULOSKELETAL:  Normal muscle strength and tone in all four extremities.  No peripheral edema or cyanosis. ?SKIN: Skin turgor is normal. There are no pathologic skin lesions.  ?NEUROLOGIC:  Motor and sensation is grossly normal.  Cranial nerves are grossly intact. ?PSYCH:  Alert and oriented to person, place and time. Affect is normal. ? ?Laboratory Analysis: ?-No recent labs ? ?Imaging: ?Ultrasound RUQ on 06/21/2021: ?IMPRESSION: ?1. Multiple gallbladder polyps, largest 1.3 cm. The largest polyp ?described on 01/29/2017 outside abdominal sonogram report was 0.9 ?cm. Findings suggest mild growth. Surgical consultation advised. This recommendation follows ACR consensus guidelines: White Paper of the ACR Incidental Findings Committee II on Gallbladder and Biliary ?Findings. J Am Coll Radiol 2013:;10:953-956. ?2. No biliary ductal dilatation. ?3. Diffusely mildly echogenic liver parenchyma, a nonspecific finding most commonly due to diffuse hepatic steatosis, with hepatic fibrosis not  excluded. Suggest correlation with liver function tests. Consider hepatic elastography for further liver fibrosis risk stratification, as clinically warranted. ? ?Ultrasound RUQ on 01/28/2017: ?Impression:  ?1. No sonographic evidence of acute cholecystitis.  ?2. Two nonmobile lesions are identified in the gallbladder which likely  ?represent gallbladder polyps measuring up to 0.9 cm. Recommend follow-up in one year to ensure stability.   ?3. Indeterminate hyperechoic lesion of the right kidney, which could  ?represent a small AML versus other neoplastic process. Comparison with prior imaging or CT could be performed for further evaluation.   ? ?Echocardiogram On 07/05/2021: ?INTERPRETATION  ?NORMAL LEFT VENTRICULAR SYSTOLIC FUNCTION  ?NORMAL RIGHT VENTRICULAR SYSTOLIC FUNCTION  ?TRIVIAL REGURGITATION NOTED (See above)  ?NO VALVULAR STENOSIS  ?ESTIMATED LVEF >55% (CALCULATED: 72.5%)  ?GLS: -26.5  ? ?Assessment and Plan: ?This is a 56 y.o. female with gallbladder polyps and cholelithiasis. ? ?- Discussed with the patient the findings on her ultrasound done last month but the patient reports that she has done an ultrasound herself and showed me pictures and videos of gallbladder  which did appear to have polyps and stones.  If that is of her gallbladder itself, then I would agree that she also has cholelithiasis although this was not found on ultrasound done in our hospital last month.  Nonetheless, the findings of the polyps increasing in size is of concern and I would recommend proceeding with cholecystectomy. ?- Discussed with her my plan for robotic assisted cholecystectomy with ICG cholangiogram to evaluate the biliary anatomy better.  Reviewed with her the surgery at length including the risks of bleeding, infection, injury to surrounding structures, that this an outpatient procedure, postoperative recovery, postoperative activity restrictions, use of ICG, and she is willing to proceed. ?- Patient would like to be  scheduled for early May in order to coordinate her schedule better at work.  We will schedule her for 08/27/2021.  We will set up a follow-up appointment in late April for H&P update.  All questions have been answered. ?

## 2021-07-12 NOTE — Patient Instructions (Addendum)
You have requested to have your gallbladder removed. This will be done at [facility] with Dr. Aleen CampiPiscoya. ? ?You will most likely be out of work 1 week for this surgery, and return with a lifting restriction for 4 more weeks. If you have FMLA or Disability paperwork that needs to be filled out, please have your company fax your paperwork to [phone] or you may drop this by the office. This paperwork will be filled out within 3 days after your surgery has been completed. ? ? ?Laparoscopic Cholecystectomy ?Laparoscopic cholecystectomy is surgery to remove the gallbladder. The gallbladder is located in the upper right part of the abdomen, behind the liver. It is a storage sac for bile, which is produced in the liver. Bile aids in the digestion and absorption of fats. Cholecystectomy is often done for inflammation of the gallbladder (cholecystitis). This condition is usually caused by a buildup of gallstones (cholelithiasis) in the gallbladder. Gallstones can block the flow of bile, and that can result in inflammation and pain. In severe cases, emergency surgery may be required. If emergency surgery is not required, you will have time to prepare for the procedure. ?Laparoscopic surgery is an alternative to open surgery. Laparoscopic surgery has a shorter recovery time. Your common bile duct may also need to be examined during the procedure. If stones are found in the common bile duct, they may be removed. ?LET [facility] PROVIDER KNOW ABOUT: ?Any allergies you have. ?All medicines you are taking, including vitamins, herbs, eye drops, creams, and over-the-counter medicines. ?Previous problems you or members of your family have had with the use of anesthetics. ?Any blood disorders you have. ?Previous surgeries you have had. ? ?Any medical conditions you have. ?RISKS AND COMPLICATIONS ?Generally, this is a safe procedure. However, problems may occur, including: ?Infection. ?Bleeding. ?Allergic reactions to  medicines. ?Damage to other structures or organs. ?A stone remaining in the common bile duct. ?A bile leak from the cyst duct that is clipped when your gallbladder is removed. ?The need to convert to open surgery, which requires a larger incision in the abdomen. This may be necessary if your surgeon thinks that it is not safe to continue with a laparoscopic procedure. ?BEFORE THE PROCEDURE ?Ask your health care provider about: ?Changing or stopping your regular medicines. This is especially important if you are taking diabetes medicines or blood thinners. ?Taking medicines such as aspirin and ibuprofen. These medicines can thin your blood. Do not take these medicines before your procedure if your health care provider instructs you not to. ?Follow instructions from your health care provider about eating or drinking restrictions. ?Let your health care provider know if you develop a cold or an infection before surgery. ?Plan to have someone take you home after the procedure. ?Ask your health care provider how your surgical site will be marked or identified. ?You may be given antibiotic medicine to help prevent infection. ?PROCEDURE ?To reduce your risk of infection: ?Your health care team will wash or sanitize their hands. ?Your skin will be washed with soap. ?An IV tube may be inserted into one of your veins. ?You will be given a medicine to make you fall asleep (general anesthetic). ?A breathing tube will be placed in your mouth. ?The surgeon will make several small cuts (incisions) in your abdomen. ?A thin, lighted tube (laparoscope) that has a tiny camera on the end will be inserted through one of the small incisions. The camera on the laparoscope will send a picture to a  TV screen (monitor) in the operating room. This will give the surgeon a good view inside your abdomen. ?A gas will be pumped into your abdomen. This will expand your abdomen to give the surgeon more room to perform the surgery. ?Other tools that  are needed for the procedure will be inserted through the other incisions. The gallbladder will be removed through one of the incisions. ?After your gallbladder has been removed, the incisions will be closed with stitches (sutures), staples, or skin glue. ?Your incisions may be covered with a bandage (dressing). ?The procedure may vary among health care providers and hospitals. ?AFTER THE PROCEDURE ?Your blood pressure, heart rate, breathing rate, and blood oxygen level will be monitored often until the medicines you were given have worn off. ?You will be given medicines as needed to control your pain. ?  ?This information is not intended to replace advice given to you by your health care provider. Make sure you discuss any questions you have with your health care provider. ?  ?Document Released: 04/14/2005 Document Revised: 01/03/2015 Document Reviewed: 11/24/2012 ?Elsevier Interactive Patient Education ?2016 Elsevier Inc. ? ? ?Low-Fat Diet for Gallbladder Conditions ?A low-fat diet can be helpful if you have pancreatitis or a gallbladder condition. With these conditions, your pancreas and gallbladder have trouble digesting fats. A healthy eating plan with less fat will help rest your pancreas and gallbladder and reduce your symptoms. ?WHAT DO I NEED TO KNOW ABOUT THIS DIET? ?Eat a low-fat diet. ?Reduce your fat intake to less than 20-30% of your total daily calories. This is less than 50-60 g of fat per day. ?Remember that you need some fat in your diet. Ask your dietician what your daily goal should be. ?Choose nonfat and low-fat healthy foods. Look for the words "nonfat," "low fat," or "fat free." ?As a guide, look on the label and choose foods with less than 3 g of fat per serving. Eat only one serving. ?Avoid alcohol. ?Do not smoke. If you need help quitting, talk with your health care provider. ?Eat small frequent meals instead of three large heavy meals. ?WHAT FOODS CAN I EAT? ?Grains ?Include healthy grains  and starches such as potatoes, wheat bread, fiber-rich cereal, and brown rice. Choose whole grain options whenever possible. In adults, whole grains should account for 45-65% of your daily calories.  ?Fruits and Vegetables ?Eat plenty of fruits and vegetables. Fresh fruits and vegetables add fiber to your diet. ?Meats and Other Protein Sources ?Eat lean meat such as chicken and pork. Trim any fat off of meat before cooking it. Eggs, fish, and beans are other sources of protein. In adults, these foods should account for 10-35% of your daily calories. ?Dairy ?Choose low-fat milk and dairy options. Dairy includes fat and protein, as well as calcium.  ?Fats and Oils ?Limit high-fat foods such as fried foods, sweets, baked goods, sugary drinks.  ?Other ?Creamy sauces and condiments, such as mayonnaise, can add extra fat. Think about whether or not you need to use them, or use smaller amounts or low fat options. ?WHAT FOODS ARE NOT RECOMMENDED? ?High fat foods, such as: ?[employer]. ?Ice cream. ?[place] toast. ?Sweet rolls. ?Pizza. ?Cheese bread. ?Foods covered with batter, butter, creamy sauces, or cheese. ?Fried foods. ?Sugary drinks and desserts. ?Foods that cause gas or bloating ?  ?This information is not intended to replace advice given to you by your health care provider. Make sure you discuss any questions you have with your health care provider. ?  ?  Document Released: 04/19/2013 Document Reviewed: 04/19/2013 ?Elsevier Interactive Patient Education ?2016 Elsevier Inc. ? ? ?

## 2021-07-15 ENCOUNTER — Telehealth: Payer: Self-pay | Admitting: Surgery

## 2021-07-15 NOTE — Telephone Encounter (Signed)
Incoming call from patient, she was scheduled for cholecystectomy for 08/27/21, but then decided to cancel.  She does not want to reschedule at this time, says there is too much going on.  At patient's request, surgery is cancelled.  She does have follow up on 08/23/21 with Dr. Aleen Campi and says hopefully will be at a better place to reschedule surgery.   ? ?

## 2021-07-19 ENCOUNTER — Other Ambulatory Visit: Payer: Self-pay | Admitting: Obstetrics and Gynecology

## 2021-07-19 ENCOUNTER — Ambulatory Visit
Admission: RE | Admit: 2021-07-19 | Discharge: 2021-07-19 | Disposition: A | Discharge status: Home / Self Care | Payer: No Typology Code available for payment source | Source: Ambulatory Visit | Attending: Family Medicine | Admitting: Family Medicine

## 2021-07-19 ENCOUNTER — Other Ambulatory Visit: Payer: Self-pay

## 2021-07-19 ENCOUNTER — Other Ambulatory Visit: Payer: Self-pay | Admitting: Orthopaedic Surgery

## 2021-07-19 ENCOUNTER — Other Ambulatory Visit (HOSPITAL_COMMUNITY): Payer: Self-pay | Admitting: Orthopaedic Surgery

## 2021-07-19 DIAGNOSIS — N63 Unspecified lump in unspecified breast: Secondary | ICD-10-CM

## 2021-07-19 DIAGNOSIS — M25561 Pain in right knee: Secondary | ICD-10-CM

## 2021-07-19 DIAGNOSIS — R928 Other abnormal and inconclusive findings on diagnostic imaging of breast: Secondary | ICD-10-CM | POA: Insufficient documentation

## 2021-07-19 DIAGNOSIS — N6320 Unspecified lump in the left breast, unspecified quadrant: Secondary | ICD-10-CM | POA: Insufficient documentation

## 2021-07-22 ENCOUNTER — Other Ambulatory Visit: Payer: Self-pay

## 2021-07-24 ENCOUNTER — Telehealth: Payer: Self-pay | Admitting: *Deleted

## 2021-07-24 NOTE — Telephone Encounter (Signed)
Patient called stating she needs orders signed for additional imaging at [facility]. I called [facility] and was told by Victorino Dike they have the orders and they have been signed by Dr.Silva and Clearnce Hasten ( scheduler) will call her to schedule.  ?

## 2021-07-24 NOTE — Telephone Encounter (Signed)
Patient scheduled on 08/06/21 for additional imaging.  ?

## 2021-08-01 ENCOUNTER — Ambulatory Visit: Payer: No Typology Code available for payment source

## 2021-08-05 ENCOUNTER — Ambulatory Visit
Admission: RE | Admit: 2021-08-05 | Discharge: 2021-08-05 | Disposition: A | Discharge status: Home / Self Care | Payer: PRIVATE HEALTH INSURANCE | Source: Ambulatory Visit | Attending: Orthopaedic Surgery | Admitting: Orthopaedic Surgery

## 2021-08-05 DIAGNOSIS — M25561 Pain in right knee: Secondary | ICD-10-CM | POA: Diagnosis present

## 2021-08-06 ENCOUNTER — Ambulatory Visit
Admission: RE | Admit: 2021-08-06 | Discharge: 2021-08-06 | Disposition: A | Discharge status: Home / Self Care | Payer: No Typology Code available for payment source | Source: Ambulatory Visit | Attending: Obstetrics and Gynecology | Admitting: Obstetrics and Gynecology

## 2021-08-06 DIAGNOSIS — N63 Unspecified lump in unspecified breast: Secondary | ICD-10-CM | POA: Insufficient documentation

## 2021-08-06 HISTORY — PX: BREAST BIOPSY: SHX20

## 2021-08-07 LAB — SURGICAL PATHOLOGY

## 2021-08-20 ENCOUNTER — Other Ambulatory Visit: Payer: Self-pay

## 2021-08-20 ENCOUNTER — Other Ambulatory Visit: Payer: No Typology Code available for payment source

## 2021-08-20 ENCOUNTER — Ambulatory Visit: Payer: No Typology Code available for payment source | Admitting: Gastroenterology

## 2021-08-20 ENCOUNTER — Encounter: Payer: Self-pay | Admitting: Gastroenterology

## 2021-08-20 ENCOUNTER — Inpatient Hospital Stay: Admission: RE | Admit: 2021-08-20 | Payer: No Typology Code available for payment source | Source: Ambulatory Visit

## 2021-08-20 VITALS — BP 103/54 | HR 71 | Temp 98.1°F | Ht 65.5 in | Wt 137.2 lb

## 2021-08-20 DIAGNOSIS — R1084 Generalized abdominal pain: Secondary | ICD-10-CM | POA: Diagnosis not present

## 2021-08-20 DIAGNOSIS — R140 Abdominal distension (gaseous): Secondary | ICD-10-CM

## 2021-08-20 DIAGNOSIS — R1011 Right upper quadrant pain: Secondary | ICD-10-CM

## 2021-08-20 MED ORDER — NA SULFATE-K SULFATE-MG SULF 17.5-3.13-1.6 GM/177ML PO SOLN
354.0000 mL | Freq: Once | ORAL | 0 refills | Status: AC
  Filled 2021-08-20 – 2021-08-29 (×2): qty 354, 1d supply, fill #0

## 2021-08-20 NOTE — Progress Notes (Signed)
?  ?Autumn Repress, MD ?[address]  ?[address]  ?[place], [place] [address]  ?Main: [phone]  ?Fax: [phone] ? ? ? ?Gastroenterology Consultation ? ?Referring Provider:     Duanne Limerick, MD ?Primary Care Physician:  Autumn Limerick, MD ?Primary Gastroenterologist:  Dr. Arlyss Brown ?Reason for Consultation:     Right upper quadrant pain, central abdominal pain ?      ? HPI:   ?Autumn Brown is a 56 y.o. female referred by Dr. Duanne Limerick, MD  for consultation & management of several years history of central abdominal pain associated with intermittent abdominal bloating and worse after eating.  Patient moved from [place] to [place] 6 years ago.  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  Patient also has been experiencing intermittent right upper quadrant pain, found to have slowly growing gallbladder polyps and she thought she had gallstones as well.  She was also found to have fatty liver and patient reports that she is known to have fatty liver for several years.  Her LFTs have been normal.  Patient was also evaluated by Dr. Aleen Brown for cholecystectomy and was originally scheduled for surgery on 5/2.  However, patient wanted to seek consultation with GI for further evaluation before proceeding with cholecystectomy.  Patient denies any weight loss, nausea or vomiting.  Denies any particular relation to food.  No known history of anemia, normal TSH ? ?Patient does not smoke or drink alcohol ?Patient is awaiting right knee arthroscopy ? ?NSAIDs: None ? ?Antiplts/Anticoagulants/Anti thrombotics: None ? ?GI Procedures: Patient reports undergoing colonoscopy back in [place] at age 24 ? ?Past Medical History:  ?Diagnosis Date  ? Abnormal uterine bleeding   ? Elevated LDL cholesterol level 2020  ? Hypercholesterolemia   ? Left bundle branch block   ? Urinary incontinence   ? ? ?Past Surgical History:   ?Procedure Laterality Date  ? APPENDECTOMY  1996  ? BREAST BIOPSY Left 08/06/2021  ? stereo biopsy/ coil clip/ path pending  ? ?Current Outpatient Medications:  ?  albuterol (VENTOLIN HFA) 108 (90 Base) MCG/ACT inhaler, Inhale 2 puffs into the lungs every 6 (six) hours as needed for wheezing or shortness of breath., Disp: 8.5 g, Rfl: 0 ?  FLUoxetine (PROZAC) 20 MG capsule, Take 1 capsule by mouth daily., Disp: , Rfl:  ?  loratadine (CLARITIN) 10 MG tablet, Take 1 tablet (10 mg total) by mouth daily., Disp: 90 tablet, Rfl: 3 ?  loratadine (CLARITIN) 10 MG tablet, Take 1 tablet by mouth as needed., Disp: , Rfl:  ?  Na Sulfate-K Sulfate-Mg Sulf 17.5-3.13-1.6 GM/177ML SOLN, Take 354 mLs by mouth once for 1 dose., Disp: 354 mL, Rfl: 0 ?  progesterone (PROMETRIUM) 100 MG capsule, Take 1 capsule every day by oral route., Disp: 90 capsule, Rfl: 0 ?  triamcinolone cream (KENALOG) 0.1 %, Apply 1 application topically 2 (two) times daily., Disp: 30 g, Rfl: 3 ?  Estradiol 10 MCG TABS vaginal tablet, Place vaginally. (Patient not taking: Reported on 08/20/2021), Disp: , Rfl:  ? ? ?Family History  ?Problem Relation Age of Onset  ? Diabetes Mother   ? Cancer Maternal Aunt   ?     colon cancer  ? Cancer Paternal Aunt   ?     cervical  ? Lung cancer Maternal Grandfather   ? Breast cancer Neg Hx   ?  ? ?Social History  ? ?  Tobacco Use  ? Smoking status: Never  ?  Passive exposure: Never  ? Smokeless tobacco: Never  ?Vaping Use  ? Vaping Use: Never used  ?Substance Use Topics  ? Alcohol use: No  ? Drug use: No  ? ? ?Allergies as of 08/20/2021 - Review Complete 08/20/2021  ?Allergen Reaction Noted  ? Honey bee venom Itching, Shortness Of Breath, and Swelling 08/20/2021  ? Kiwi extract Swelling 08/20/2021  ? Pork-derived products  07/12/2021  ? ? ?Review of Systems:    ?All systems reviewed and negative except where noted in HPI. ? ? Physical Exam:  ?BP (!) 103/54 (BP Location: Left Arm, Patient Position: Sitting, Cuff Size: Normal)    Pulse 71   Temp 98.1 ?F (36.7 ?C) (Oral)   Ht 5' 5.5" (1.664 m)   Wt 137 lb 4 oz (62.3 kg)   LMP 08/18/2019 (Exact Date)   BMI 22.49 kg/m?  ?Patient's last menstrual period was 08/18/2019 (exact date). ? ?General:   Alert,  Well-developed, well-nourished, pleasant and cooperative in NAD ?Head:  Normocephalic and atraumatic. ?Eyes:  Sclera clear, no icterus.   Conjunctiva pink. ?Ears:  Normal auditory acuity. ?Nose:  No deformity, discharge, or lesions. ?Mouth:  No deformity or lesions,oropharynx pink & moist. ?Neck:  Supple; no masses or thyromegaly. ?Lungs:  Respirations even and unlabored.  Clear throughout to auscultation.   No wheezes, crackles, or rhonchi. No acute distress. ?Heart:  Regular rate and rhythm; no murmurs, clicks, rubs, or gallops. ?Abdomen:  Normal bowel sounds. Soft, non-tender and non-distended without masses, hepatosplenomegaly or hernias noted.  No guarding or rebound tenderness.   ?Rectal: Not performed ?Msk:  Symmetrical without gross deformities. Good, equal movement & strength bilaterally. ?Pulses:  Normal pulses noted. ?Extremities:  No clubbing or edema.  No cyanosis. ?Neurologic:  Alert and oriented x3;  grossly normal neurologically. ?Skin:  Intact without significant lesions or rashes. No jaundice. ?Psych:  Alert and cooperative. Normal mood and affect. ? ?Imaging Studies: ?Reviewed ? ?Assessment and Plan:  ? ?Autumn Brown is a 56 y.o. pleasant [place] female with chronic symptoms of intermittent right upper quadrant pain, central abdominal pain associated with abdominal bloating and urgency, found to have slowly growing gallbladder polyps ? ?Recommend upper endoscopy with gastric and duodenal biopsies ?Colonoscopy with possible TI evaluation and biopsies ?Encouraged patient to follow-up with Dr. Aleen Brown for cholecystectomy ?She is interested to be evaluated for food intolerances, we will order alpha gal panel and food allergy profile ? ? ?Follow up based on the above  work-up ? ? ?Autumn Repress, MD ? ?

## 2021-08-23 ENCOUNTER — Encounter: Payer: Self-pay | Admitting: Gastroenterology

## 2021-08-23 ENCOUNTER — Ambulatory Visit: Payer: No Typology Code available for payment source | Admitting: Surgery

## 2021-08-23 ENCOUNTER — Other Ambulatory Visit: Payer: Self-pay | Admitting: Orthopaedic Surgery

## 2021-08-23 LAB — FOOD ALLERGY PROFILE
Allergen Corn, IgE: <0.1 kU/L
Clam IgE: <0.1 kU/L
Codfish IgE: <0.1 kU/L
Egg White IgE: <0.1 kU/L
Milk IgE: <0.1 kU/L
Peanut IgE: 0.12 kU/L — AB
Scallop IgE: <0.1 kU/L
Sesame Seed IgE: 0.14 kU/L — AB
Shrimp IgE: 0.15 kU/L — AB
Soybean IgE: <0.1 kU/L
Walnut IgE: <0.1 kU/L
Wheat IgE: <0.1 kU/L

## 2021-08-23 LAB — ALPHA-GAL PANEL
Allergen Lamb IgE: 0.11 kU/L — AB
Beef IgE: <0.1 kU/L
IgE (Immunoglobulin E), Serum: 223 IU/mL (ref 6–495)
O215-IgE Alpha-Gal: <0.1 kU/L
Pork IgE: <0.1 kU/L

## 2021-08-27 ENCOUNTER — Ambulatory Visit: Admit: 2021-08-27 | Payer: No Typology Code available for payment source | Admitting: Surgery

## 2021-08-27 SURGERY — CHOLECYSTECTOMY, ROBOT-ASSISTED, LAPAROSCOPIC
Anesthesia: General

## 2021-08-29 ENCOUNTER — Other Ambulatory Visit: Payer: Self-pay

## 2021-08-30 ENCOUNTER — Other Ambulatory Visit: Payer: Self-pay

## 2021-09-04 ENCOUNTER — Other Ambulatory Visit: Payer: Self-pay | Admitting: Orthopedic Surgery

## 2021-09-04 NOTE — Progress Notes (Deleted)
GYNECOLOGY  VISIT   HPI: 56 y.o.   Married  female   [phone] with Patient's last menstrual period was 08/18/2019 (exact date).   here for     GYNECOLOGIC HISTORY: Patient's last menstrual period was 08/18/2019 (exact date). Contraception: PM Menopausal hormone therapy: Estradiol (vaginal)/ Oral progesterone Last mammogram: 05/21/21- birads 0; 06/07/21-US/Diag-birads 4 78mm mass @ 1130 Lt breast; 08/06/21-Bx-benign-recommended 6 mo f/u Lt Diag and possible [place].  Last pap smear: 02/04/18-WNL, HPV- neg        OB History     Gravida  4   Para  3   Term  3   Preterm  0   AB  1   Living  3      SAB  1   IAB      Ectopic      Multiple      Live Births  3              Patient Active Problem List   Diagnosis Date Noted   Adhesive capsulitis of left shoulder 11/02/2017   Irritable bowel syndrome 11/02/2017   History of BCG vaccination 10/23/2017   Depression 07/01/2017   Asthma 07/01/2017   Left thyroid nodule 01/30/2017   Prediabetes 08/03/2016    Past Medical History:  Diagnosis Date   Abnormal uterine bleeding    Elevated LDL cholesterol level 2020   Hypercholesterolemia    Left bundle branch block    Urinary incontinence     Past Surgical History:  Procedure Laterality Date   APPENDECTOMY  1996   BREAST BIOPSY Left 08/06/2021   stereo biopsy/ coil clip/ path pending    Current Outpatient Medications  Medication Sig Dispense Refill   albuterol (VENTOLIN HFA) 108 (90 Base) MCG/ACT inhaler Inhale 2 puffs into the lungs every 6 (six) hours as needed for wheezing or shortness of breath. 8.5 g 0   Estradiol 10 MCG TABS vaginal tablet Place vaginally. (Patient not taking: Reported on 08/20/2021)     FLUoxetine (PROZAC) 20 MG capsule Take 1 capsule by mouth daily.     loratadine (CLARITIN) 10 MG tablet Take 1 tablet (10 mg total) by mouth daily. 90 tablet 3   loratadine (CLARITIN) 10 MG tablet Take 1 tablet by mouth as needed.     progesterone (PROMETRIUM)  100 MG capsule Take 1 capsule every day by oral route. 90 capsule 0   triamcinolone cream (KENALOG) 0.1 % Apply 1 application topically 2 (two) times daily. 30 g 3   No current facility-administered medications for this visit.     ALLERGIES: Honey bee venom, Kiwi extract, and Pork-derived products  Family History  Problem Relation Age of Onset   Diabetes Mother    Cancer Maternal Aunt        colon cancer   Cancer Paternal Aunt        cervical   Lung cancer Maternal Grandfather    Breast cancer Neg Hx     Social History   Socioeconomic History   Marital status: Married    Spouse name: Not on file   Number of children: Not on file   Years of education: Not on file   Highest education level: Not on file  Occupational History   Not on file  Tobacco Use   Smoking status: Never    Passive exposure: Never   Smokeless tobacco: Never  Vaping Use   Vaping Use: Never used  Substance and Sexual Activity   Alcohol use: No  Drug use: No   Sexual activity: Yes    Partners: Male    Birth control/protection: Post-menopausal  Other Topics Concern   Not on file  Social History Narrative   Not on file   Social Determinants of Health   Financial Resource Strain: Not on file  Food Insecurity: Not on file  Transportation Needs: Not on file  Physical Activity: Not on file  Stress: Not on file  Social Connections: Not on file  Intimate Partner Violence: Not on file    Review of Systems  PHYSICAL EXAMINATION:    LMP 08/18/2019 (Exact Date)     General appearance: alert, cooperative and appears stated age Head: Normocephalic, without obvious abnormality, atraumatic Neck: no adenopathy, supple, symmetrical, trachea midline and thyroid normal to inspection and palpation Lungs: clear to auscultation bilaterally Breasts: normal appearance, no masses or tenderness, No nipple retraction or dimpling, No nipple discharge or bleeding, No axillary or supraclavicular adenopathy Heart:  regular rate and rhythm Abdomen: soft, non-tender, no masses,  no organomegaly Extremities: extremities normal, atraumatic, no cyanosis or edema Skin: Skin color, texture, turgor normal. No rashes or lesions Lymph nodes: Cervical, supraclavicular, and axillary nodes normal. No abnormal inguinal nodes palpated Neurologic: Grossly normal  Pelvic: External genitalia:  no lesions              Urethra:  normal appearing urethra with no masses, tenderness or lesions              Bartholins and Skenes: normal                 Vagina: normal appearing vagina with normal color and discharge, no lesions              Cervix: no lesions                Bimanual Exam:  Uterus:  normal size, contour, position, consistency, mobility, non-tender              Adnexa: no mass, fullness, tenderness              Rectal exam: {yes no:[id number]}.  Confirms.              Anus:  normal sphincter tone, no lesions  Chaperone was present for exam:  ***  ASSESSMENT     PLAN     An After Visit Summary was printed and given to the patient.  ______ minutes face to face time of which over 50% was spent in counseling.

## 2021-09-05 ENCOUNTER — Other Ambulatory Visit: Payer: Self-pay

## 2021-09-05 MED ORDER — COVID-19 AT HOME ANTIGEN TEST VI KIT
PACK | 0 refills | Status: AC
  Filled 2021-09-05: qty 2, 4d supply, fill #0

## 2021-09-06 ENCOUNTER — Ambulatory Visit (INDEPENDENT_AMBULATORY_CARE_PROVIDER_SITE_OTHER): Payer: No Typology Code available for payment source | Admitting: Obstetrics and Gynecology

## 2021-09-06 ENCOUNTER — Encounter: Payer: Self-pay | Admitting: Gastroenterology

## 2021-09-06 ENCOUNTER — Encounter: Payer: Self-pay | Admitting: Obstetrics and Gynecology

## 2021-09-06 ENCOUNTER — Ambulatory Visit
Admission: RE | Admit: 2021-09-06 | Discharge: 2021-09-06 | Disposition: A | Discharge status: Home / Self Care | Payer: No Typology Code available for payment source | Source: Ambulatory Visit | Attending: Obstetrics and Gynecology | Admitting: Obstetrics and Gynecology

## 2021-09-06 ENCOUNTER — Telehealth: Payer: Self-pay | Admitting: *Deleted

## 2021-09-06 ENCOUNTER — Telehealth: Payer: Self-pay | Admitting: Obstetrics and Gynecology

## 2021-09-06 VITALS — BP 116/74

## 2021-09-06 DIAGNOSIS — R9389 Abnormal findings on diagnostic imaging of other specified body structures: Secondary | ICD-10-CM | POA: Insufficient documentation

## 2021-09-06 NOTE — Progress Notes (Signed)
Patient requesting PUS to be completed today at [facility]. Patient scheduled while in office for PUS complete today at [facility], per Lyla Son at that location. Patient will go directly to location for imaging. Patient verbalizes understanding and is agreeable.  ?

## 2021-09-06 NOTE — Progress Notes (Signed)
GYNECOLOGY  VISIT ?  ?HPI: ?56 y.o.   Married    female   ?[id number] with Patient's last menstrual period was 08/18/2019 (exact date).   ?here for  Discuss HRT, which she started one month ago.  ? 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  ?She is currently employed as an Restaurant manager, fast food.  ? ?She checked her own endometrial lining which was 15 mm. ? ?No bleeding.  ? ?Benign left breast biopsy 08/06/21. ? ?Has colonoscopy and knee surgery next week.  ? ?She will be moving to [place] this summer.  ? ?GYNECOLOGIC HISTORY: ?Patient's last menstrual period was 08/18/2019 (exact date). ?Contraception:  PMP ?Menopausal hormone therapy:  Vivelle Dot ,Progesterone ?Last mammogram:  05-21-21 Possible asymmetry left breast 06-07-21 Unilat.Left MMG Left breast mass Cat.C BiRADS 4--08-06-21 Breast Biopsy Benign ?Last pap smear:   02-04-18 normal neg HPV ?       ?OB History   ? ? Gravida  ?4  ? Para  ?3  ? Term  ?3  ? Preterm  ?0  ? AB  ?1  ? Living  ?3  ?  ? ? SAB  ?1  ? IAB  ?   ? Ectopic  ?   ? Multiple  ?   ? Live Births  ?3  ?   ?  ?  ?    ? ?Patient Active Problem List  ? Diagnosis Date Noted  ? Bloating   ? Generalized abdominal pain   ? Adhesive capsulitis of left shoulder 11/02/2017  ? Irritable bowel syndrome 11/02/2017  ? History of BCG vaccination 10/23/2017  ? Depression 07/01/2017  ? Asthma 07/01/2017  ? Left thyroid nodule 01/30/2017  ? Prediabetes 08/03/2016  ? ? ?Past Medical History:  ?Diagnosis Date  ? Abnormal uterine bleeding   ? Elevated LDL cholesterol level 2020  ? Hypercholesterolemia   ? Left bundle branch block   ? Urinary incontinence   ? ? ?Past Surgical History:  ?Procedure Laterality Date  ? APPENDECTOMY  1996  ? BREAST BIOPSY Left 08/06/2021  ? stereo biopsy/ coil clip/ path pending  ? ? ?Current Outpatient Medications  ?Medication Sig Dispense Refill  ? albuterol (VENTOLIN HFA) 108 (90 Base) MCG/ACT inhaler Inhale 2 puffs into the lungs every 6 (six) hours as needed for wheezing or shortness of breath.  8.5 g 0  ? COVID-19 At Home Antigen Test [facility] COVID-19 HOME TEST) KIT Use as directed 2 kit 0  ? estradiol (VIVELLE-DOT) 0.05 MG/24HR patch Place 1 patch onto the skin 2 (two) times a week.    ? FLUoxetine (PROZAC) 20 MG capsule Take 20 mg by mouth daily.    ? loratadine (CLARITIN) 10 MG tablet Take 1 tablet (10 mg total) by mouth daily. (Patient taking differently: Take 10 mg by mouth daily as needed for allergies.) 90 tablet 3  ? progesterone (PROMETRIUM) 100 MG capsule Take 1 capsule every day by oral route. 90 capsule 0  ? triamcinolone cream (KENALOG) 0.1 % Apply 1 application topically 2 (two) times daily. (Patient taking differently: Apply 1 application. topically 2 (two) times daily as needed (irritation).) 30 g 3  ? ?No current facility-administered medications for this visit.  ?  ? ?ALLERGIES: Honey bee venom, Kiwi extract, Other, Peanut-containing drug products, Pork-derived products, Sesame seed (diagnostic), and Shrimp (diagnostic) ? ?Family History  ?Problem Relation Age of Onset  ? Diabetes Mother   ? Cancer Maternal Aunt   ?     colon cancer  ? Cancer Paternal  Aunt   ?     cervical  ? Lung cancer Maternal Grandfather   ? Breast cancer Neg Hx   ? ? ?Social History  ? ?Socioeconomic History  ? Marital status: Married  ?  Spouse name: Not on file  ? Number of children: Not on file  ? Years of education: Not on file  ? Highest education level: Not on file  ?Occupational History  ? Not on file  ?Tobacco Use  ? Smoking status: Never  ?  Passive exposure: Never  ? Smokeless tobacco: Never  ?Vaping Use  ? Vaping Use: Never used  ?Substance and Sexual Activity  ? Alcohol use: No  ? Drug use: No  ? Sexual activity: Yes  ?  Partners: Male  ?  Birth control/protection: Post-menopausal  ?Other Topics Concern  ? Not on file  ?Social History Narrative  ? Not on file  ? ?Social Determinants of Health  ? ?Financial Resource Strain: Not on file  ?Food Insecurity: Not on file  ?Transportation Needs: Not on file   ?Physical Activity: Not on file  ?Stress: Not on file  ?Social Connections: Not on file  ?Intimate Partner Violence: Not on file  ? ? ?Review of Systems ? ?PHYSICAL EXAMINATION:   ? ?BP 116/74 (BP Location: Right Arm, Patient Position: Sitting, Cuff Size: Normal)   LMP 08/18/2019 (Exact Date)     ?General appearance: alert, cooperative and appears stated age ? ?Pelvic: External genitalia:  no lesions ?             Urethra:  normal appearing urethra with no masses, tenderness or lesions ?             Bartholins and Skenes: normal    ?             Vagina: normal appearing vagina with normal color and discharge, no lesions ?             Cervix: no lesions ?               ?Bimanual Exam:  Uterus:  normal size, contour, position, consistency, mobility, non-tender ?             Adnexa: no mass, fullness, tenderness ?      ?Chaperone was present for exam:  yes ? ?ASSESSMENT ? ?Endometrial thickening.  ?HRT.   ? ?PLAN ? ?Will get formal pelvic ultrasound with final recommendations to follow.  ?If thickened endometrium confirmed, patient will need endometrial sampling.  ?Thickening may represent a polyp, fibroid, hyperplasia, or malignancy. ?  ?An After Visit Summary was printed and given to the patient. ? ?25 min  total time was spent for this patient encounter, including preparation, face-to-face counseling with the patient, coordination of care, and documentation of the encounter. ? ?

## 2021-09-06 NOTE — Telephone Encounter (Signed)
Patient called stating she had to cancel her appointment scheduled on 09/11/21 ( to discuss HRT)  due to knee surgery on 09/09/21. 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 Patient wanted to be seen today, however while on phone with patient no appointments available. I hung up with patient to document and looked at the schedule again and noticed the 2:30pm slot had opened. I called patient to tell her but I received her voicemail. I asked her to call back ASAP to schedule.  ?

## 2021-09-06 NOTE — Telephone Encounter (Signed)
I called [facility] and they cannot do ultrasound today and they do schedule on weekend. Patient informed of this as well as Dr.Silva.  ? ?Carmelina Dane, RN has taken over the scheduling for this patient.  ?

## 2021-09-06 NOTE — Telephone Encounter (Signed)
Please schedule pelvic ultrasound at [facility] today or this weekend for possible thickened endometrium on informal pelvic US.  ?

## 2021-09-08 ENCOUNTER — Encounter (HOSPITAL_COMMUNITY): Payer: Self-pay | Admitting: Orthopedic Surgery

## 2021-09-08 NOTE — Progress Notes (Signed)
Spoke with pt.  She happened to be working today at [employer]. Reviewed pre-op instructions. Given chg to shower with and printed instructions. ?PCP: Louie Boston in [place] in [place] ?Cardiologist: Gwen Pounds in [place] ?

## 2021-09-08 NOTE — Progress Notes (Signed)
Surgical Instructions ? ? ? Your procedure is scheduled on May 16. ? Report to [facility] Main Entrance "A" at 7:30 A.M., then check in with the Admitting office. ? Call this number if you have problems the morning of surgery: ? [phone] ? ? If you have any questions prior to your surgery date call [phone]: Open Monday-Friday 8am-4pm ? ? ? Remember: ? Do not eat after midnight the night before your surgery ? ?You may drink clear liquids until 7:00 the morning of your surgery.   ?Clear liquids allowed are: Water, Non-Citrus Juices (without pulp), Carbonated Beverages, Clear Tea, Black Coffee ONLY (NO MILK, CREAM OR POWDERED CREAMER of any kind), and Gatorade ?  ? Take these medicines the morning of surgery with A SIP OF WATER:  ? ?           Fluoxetine,loratadine, progesterone ? ? ?As of today, STOP taking any Aspirin (unless otherwise instructed by your surgeon) Aleve, Naproxen, Ibuprofen, Motrin, Advil, Goody's, BC's, all herbal medications, fish oil, and all vitamins. ? ?         ?Do not wear jewelry or makeup ?Do not wear lotions, powders, perfumes/colognes, or deodorant. ?Do not shave 48 hours prior to surgery.  Men may shave face and neck. ?Do not bring valuables to the hospital. ?Do not wear nail polish, gel polish, artificial nails, or any other type of covering on natural nails (fingers and toes) ?If you have artificial nails or gel coating that need to be removed by a nail salon, please have this removed prior to surgery. Artificial nails or gel coating may interfere with anesthesia's ability to adequately monitor your vital signs. ? ?[facility] is not responsible for any belongings or valuables. .  ? ?Do NOT Smoke (Tobacco/Vaping)  24 hours prior to your procedure ? ?If you use a CPAP at night, you may bring your mask for your overnight stay. ?  ?Contacts, glasses, hearing aids, dentures or partials may not be worn into surgery, please bring cases for these belongings ?  ?For patients admitted to  the hospital, discharge time will be determined by your treatment team. ?  ?Patients discharged the day of surgery will not be allowed to drive home, and someone needs to stay with them for 24 hours. ? ? ?SURGICAL WAITING ROOM VISITATION ?Patients having surgery or a procedure in a hospital may have two support people. ?Children under the age of 47 must have an adult with them who is not the patient. ?They may stay in the waiting area during the procedure and may switch out with other visitors. If the patient needs to stay at the hospital during part of their recovery, the visitor guidelines for inpatient rooms apply. ? ?Please refer to the [facility] website for the visitor guidelines for Inpatients (after your surgery is over and you are in a regular room).  ? ? ? ? ? ?Special instructions:   ? ?Oral Hygiene is also important to reduce your risk of infection.  Remember - BRUSH YOUR TEETH THE MORNING OF SURGERY WITH YOUR REGULAR TOOTHPASTE ? ? ?- Preparing For Surgery ? ?Before surgery, you can play an important role. Because skin is not sterile, your skin needs to be as free of germs as possible. You can reduce the number of germs on your skin by washing with CHG (chlorahexidine gluconate) Soap before surgery.  CHG is an antiseptic cleaner which kills germs and bonds with the skin to continue killing germs even after washing.   ? ? ?  Please do not use if you have an allergy to CHG or antibacterial soaps. If your skin becomes reddened/irritated stop using the CHG.  ?Do not shave (including legs and underarms) for at least 48 hours prior to first CHG shower. It is OK to shave your face. ? ?Please follow these instructions carefully. ?  ? ? Shower the NIGHT BEFORE SURGERY and the MORNING OF SURGERY with CHG Soap.  ? If you chose to wash your hair, wash your hair first as usual with your normal shampoo. After you shampoo, rinse your hair and body thoroughly to remove the shampoo.  Then [employer] and genitals  (private parts) with your normal soap and rinse thoroughly to remove soap. ? ?After that Use CHG Soap as you would any other liquid soap. You can apply CHG directly to the skin and wash gently with a scrungie or a clean washcloth.  ? ?Apply the CHG Soap to your body ONLY FROM THE NECK DOWN.  Do not use on open wounds or open sores. Avoid contact with your eyes, ears, mouth and genitals (private parts). Wash Face and genitals (private parts)  with your normal soap.  ? ?Wash thoroughly, paying special attention to the area where your surgery will be performed. ? ?Thoroughly rinse your body with warm water from the neck down. ? ?DO NOT shower/wash with your normal soap after using and rinsing off the CHG Soap. ? ?Pat yourself dry with a CLEAN TOWEL. ? ?Wear CLEAN PAJAMAS to bed the night before surgery ? ?Place CLEAN SHEETS on your bed the night before your surgery ? ?DO NOT SLEEP WITH PETS. ? ? ?Day of Surgery: ? ?Take a shower with CHG soap. ?Wear Clean/Comfortable clothing the morning of surgery ?Do not apply any deodorants/lotions.   ?Remember to brush your teeth WITH YOUR REGULAR TOOTHPASTE. ? ? ? ?If you received a COVID test during your pre-op visit, it is requested that you wear a mask when out in public, stay away from anyone that may not be feeling well, and notify your surgeon if you develop symptoms. If you have been in contact with anyone that has tested positive in the last 10 days, please notify your surgeon. ? ?  ?Please read over the following fact sheets that you were given.   ?

## 2021-09-09 ENCOUNTER — Encounter
Admission: RE | Disposition: A | Discharge status: Home / Self Care | Payer: Self-pay | Source: Ambulatory Visit | Attending: Gastroenterology

## 2021-09-09 ENCOUNTER — Ambulatory Visit
Admission: RE | Admit: 2021-09-09 | Discharge: 2021-09-09 | Disposition: A | Discharge status: Home / Self Care | Payer: No Typology Code available for payment source | Source: Ambulatory Visit | Attending: Gastroenterology | Admitting: Gastroenterology

## 2021-09-09 ENCOUNTER — Other Ambulatory Visit: Payer: No Typology Code available for payment source

## 2021-09-09 ENCOUNTER — Ambulatory Visit: Payer: No Typology Code available for payment source | Admitting: Anesthesiology

## 2021-09-09 ENCOUNTER — Telehealth: Payer: Self-pay

## 2021-09-09 ENCOUNTER — Encounter: Payer: Self-pay | Admitting: Gastroenterology

## 2021-09-09 DIAGNOSIS — R1013 Epigastric pain: Secondary | ICD-10-CM | POA: Insufficient documentation

## 2021-09-09 DIAGNOSIS — K319 Disease of stomach and duodenum, unspecified: Secondary | ICD-10-CM | POA: Insufficient documentation

## 2021-09-09 DIAGNOSIS — K209 Esophagitis, unspecified without bleeding: Secondary | ICD-10-CM | POA: Diagnosis not present

## 2021-09-09 DIAGNOSIS — R1084 Generalized abdominal pain: Secondary | ICD-10-CM | POA: Diagnosis not present

## 2021-09-09 DIAGNOSIS — R140 Abdominal distension (gaseous): Secondary | ICD-10-CM | POA: Diagnosis not present

## 2021-09-09 DIAGNOSIS — K2289 Other specified disease of esophagus: Secondary | ICD-10-CM | POA: Diagnosis not present

## 2021-09-09 DIAGNOSIS — R1011 Right upper quadrant pain: Secondary | ICD-10-CM

## 2021-09-09 DIAGNOSIS — K9049 Malabsorption due to intolerance, not elsewhere classified: Secondary | ICD-10-CM

## 2021-09-09 HISTORY — PX: ESOPHAGOGASTRODUODENOSCOPY (EGD) WITH PROPOFOL: SHX5813

## 2021-09-09 HISTORY — PX: COLONOSCOPY WITH PROPOFOL: SHX5780

## 2021-09-09 SURGERY — COLONOSCOPY WITH PROPOFOL
Anesthesia: General

## 2021-09-09 MED ORDER — PROPOFOL 10 MG/ML IV BOLUS
INTRAVENOUS | Status: DC | PRN
  Administered 2021-09-09: 80 mg via INTRAVENOUS
  Administered 2021-09-09: 30 mg via INTRAVENOUS
  Administered 2021-09-09 (×2): 20 mg via INTRAVENOUS
  Administered 2021-09-09: 30 mg via INTRAVENOUS

## 2021-09-09 MED ORDER — EPHEDRINE SULFATE (PRESSORS) 50 MG/ML IJ SOLN
INTRAMUSCULAR | Status: DC | PRN
  Administered 2021-09-09 (×2): 10 mg via INTRAVENOUS

## 2021-09-09 MED ORDER — ONDANSETRON HCL 4 MG/2ML IJ SOLN
INTRAMUSCULAR | Status: DC | PRN
  Administered 2021-09-09: 4 mg via INTRAVENOUS

## 2021-09-09 MED ORDER — SODIUM CHLORIDE 0.9 % IV SOLN
INTRAVENOUS | Status: DC
  Administered 2021-09-09: 20 mL/h via INTRAVENOUS

## 2021-09-09 MED ORDER — PROPOFOL 500 MG/50ML IV EMUL
INTRAVENOUS | Status: DC | PRN
Start: 2021-09-09 — End: 2021-09-09
  Administered 2021-09-09: 120 ug/kg/min via INTRAVENOUS

## 2021-09-09 MED ORDER — ONDANSETRON HCL 4 MG/2ML IJ SOLN
INTRAMUSCULAR | Status: AC
  Filled 2021-09-09: qty 2

## 2021-09-09 NOTE — Op Note (Signed)
[facility] ?Gastroenterology ?Patient Name: Autumn Brown ?Procedure Date: 09/09/2021 10:48 AM ?MRN: [id number] ?Account #: [id number] ?Date of Birth: 1965-09-30 ?Admit Type: Outpatient ?Age: 56 ?Room: [facility] ENDO ROOM 1 ?Gender: Female ?Note Status: Finalized ?Instrument Name: Upper Endoscope [id number] ?Procedure:             Upper GI endoscopy ?Indications:           Dyspepsia, Abdominal bloating, multiple food  ?                       intolerances ?Providers:             Lin Landsman MD, MD ?Medicines:             General Anesthesia ?Complications:         No immediate complications. Estimated blood loss: None. ?Procedure:             Pre-Anesthesia Assessment: ?                       - Prior to the procedure, a History and Physical was  ?                       performed, and patient medications and allergies were  ?                       reviewed. The patient is competent. The risks and  ?                       benefits of the procedure and the sedation options and  ?                       risks were discussed with the patient. All questions  ?                       were answered and informed consent was obtained.  ?                       Patient identification and proposed procedure were  ?                       verified by the physician, the nurse, the  ?                       anesthesiologist, the anesthetist and the technician  ?                       in the pre-procedure area in the procedure room in the  ?                       endoscopy suite. Mental Status Examination: alert and  ?                       oriented. Airway Examination: normal oropharyngeal  ?                       airway and neck mobility. Respiratory Examination:  ?                       clear to auscultation. CV Examination:  normal.  ?                       Prophylactic Antibiotics: The patient does not require  ?                       prophylactic antibiotics. Prior Anticoagulants: The  ?                        patient has taken no previous anticoagulant or  ?                       antiplatelet agents. ASA Grade Assessment: II - A  ?                       patient with mild systemic disease. After reviewing  ?                       the risks and benefits, the patient was deemed in  ?                       satisfactory condition to undergo the procedure. The  ?                       anesthesia plan was to use general anesthesia.  ?                       Immediately prior to administration of medications,  ?                       the patient was re-assessed for adequacy to receive  ?                       sedatives. The heart rate, respiratory rate, oxygen  ?                       saturations, blood pressure, adequacy of pulmonary  ?                       ventilation, and response to care were monitored  ?                       throughout the procedure. The physical status of the  ?                       patient was re-assessed after the procedure. ?                       After obtaining informed consent, the endoscope was  ?                       passed under direct vision. Throughout the procedure,  ?                       the patient's blood pressure, pulse, and oxygen  ?                       saturations were monitored continuously. The Endoscope  ?  was introduced through the mouth, and advanced to the  ?                       second part of duodenum. The upper GI endoscopy was  ?                       accomplished without difficulty. The patient tolerated  ?                       the procedure well. ?Findings: ?     The duodenal bulb and second portion of the duodenum were normal.  ?     Biopsies were taken with a cold forceps for histology. ?     The entire examined stomach was normal. Biopsies were taken with a cold  ?     forceps for histology. ?     Mucosal changes including congestion (edema), crepe paper esophagus,  ?     longitudinal markings and mucosal friability were found in the middle  ?      third of the esophagus and in the lower third of the esophagus. Biopsies  ?     were taken with a cold forceps for histology. ?     LA Grade A (one or more mucosal breaks less than 5 mm, not extending  ?     between tops of 2 mucosal folds) esophagitis with no bleeding was found  ?     at the gastroesophageal junction. ?     Esophagogastric landmarks were identified: the gastroesophageal junction  ?     was found at 35 cm from the incisors. ?Impression:            - Normal duodenal bulb and second portion of the  ?                       duodenum. Biopsied. ?                       - Normal stomach. Biopsied. ?                       - Esophageal mucosal changes suspicious for  ?                       eosinophilic esophagitis. Biopsied. ?                       - LA Grade A esophagitis with no bleeding. ?                       - Esophagogastric landmarks identified. ?Recommendation:        - Await pathology results. ?                       - Proceed with colonoscopy as scheduled ?                       See colonoscopy report ?Procedure Code(s):     --- Professional --- ?                       [phone], Esophagogastroduodenoscopy, flexible,  ?  transoral; with biopsy, single or multiple ?Diagnosis Code(s):     --- Professional --- ?                       K22.8, Other specified diseases of esophagus ?                       K20.90, Esophagitis, unspecified without bleeding ?                       R10.13, Epigastric pain ?                       R14.0, Abdominal distension (gaseous) ?CPT copyright 2019 American Medical Association. All rights reserved. ?The codes documented in this report are preliminary and upon coder review may  ?be revised to meet current compliance requirements. ?Dr. Ulyess Mort ?Alexzia Kasler Raeanne Gathers MD, MD ?09/09/2021 11:17:50 AM ?This report has been signed electronically. ?Number of Addenda: 0 ?Note Initiated On: 09/09/2021 10:48 AM ?Estimated Blood Loss:  Estimated blood loss:  none. ?     [facility] ?

## 2021-09-09 NOTE — Anesthesia Preprocedure Evaluation (Signed)
Anesthesia Evaluation  ?Patient identified by MRN, date of birth, ID band ?Patient awake ? ? ? ?Reviewed: ?Allergy & Precautions, H&P , NPO status , Patient's Chart, lab work & pertinent test results, reviewed documented beta blocker date and time  ? ?History of Anesthesia Complications ?Negative for: history of anesthetic complications ? ?Airway ?Mallampati: III ? ?TM Distance: >3 FB ?Neck ROM: full ? ? ? Dental ? ?(+) Dental Advidsory Given, Caps, Teeth Intact ?  ?Pulmonary ?neg shortness of breath, asthma , neg sleep apnea, neg COPD, neg recent URI,  ?  ?Pulmonary exam normal ?breath sounds clear to auscultation ? ? ? ? ? ? Cardiovascular ?Exercise Tolerance: Good ?(-) hypertension(-) angina(-) Past MI and (-) Cardiac Stents Normal cardiovascular exam+ dysrhythmias (LBBB) (-) Valvular Problems/Murmurs ?Rhythm:regular Rate:Normal ? ? ?  ?Neuro/Psych ?PSYCHIATRIC DISORDERS Depression negative neurological ROS ?   ? GI/Hepatic ?negative GI ROS, Neg liver ROS,   ?Endo/Other  ?negative endocrine ROS ? Renal/GU ?negative Renal ROS  ?negative genitourinary ?  ?Musculoskeletal ? ? Abdominal ?  ?Peds ? Hematology ?negative hematology ROS ?(+)   ?Anesthesia Other Findings ?Past Medical History: ?No date: Abnormal uterine bleeding ?2020: Elevated LDL cholesterol level ?No date: Hypercholesterolemia ?No date: Left bundle branch block ?No date: Urinary incontinence ? ? Reproductive/Obstetrics ?negative OB ROS ? ?  ? ? ? ? ? ? ? ? ? ? ? ? ? ?  ?  ? ? ? ? ? ? ? ? ?Anesthesia Physical ?Anesthesia Plan ? ?ASA: 2 ? ?Anesthesia Plan: General  ? ?Post-op Pain Management:   ? ?Induction: Intravenous ? ?PONV Risk Score and Plan: 3 and Propofol infusion and TIVA ? ?Airway Management Planned: Natural Airway and Nasal Cannula ? ?Additional Equipment:  ? ?Intra-op Plan:  ? ?Post-operative Plan:  ? ?Informed Consent: I have reviewed the patients History and Physical, chart, labs and discussed the procedure  including the risks, benefits and alternatives for the proposed anesthesia with the patient or authorized representative who has indicated his/her understanding and acceptance.  ? ? ? ?Dental Advisory Given ? ?Plan Discussed with: Anesthesiologist, CRNA and Surgeon ? ?Anesthesia Plan Comments:   ? ? ? ? ? ? ?Anesthesia Quick Evaluation ? ?

## 2021-09-09 NOTE — Telephone Encounter (Signed)
-----   Message from Toney Reil, MD sent at 09/09/2021 11:44 AM EDT ----- ?Regarding: Referral ?Please refer to allergy and immunology ?Dx: Multiple food intolerances ? ?RV ? ?

## 2021-09-09 NOTE — Op Note (Signed)
[facility] ?Gastroenterology ?Patient Name: Autumn Brown ?Procedure Date: 09/09/2021 10:48 AM ?MRN: [id number] ?Account #: [id number] ?Date of Birth: Aug 29, 1965 ?Admit Type: Outpatient ?Age: 56 ?Room: [facility] ENDO ROOM 1 ?Gender: Female ?Note Status: Finalized ?Instrument Name: Peds Colonoscope [id number] ?Procedure:             Colonoscopy ?Indications:           Generalized abdominal pain ?Providers:             Lin Landsman MD, MD ?Medicines:             General Anesthesia ?Complications:         No immediate complications. Estimated blood loss: None. ?Procedure:             Pre-Anesthesia Assessment: ?                       - Prior to the procedure, a History and Physical was  ?                       performed, and patient medications and allergies were  ?                       reviewed. The patient is competent. The risks and  ?                       benefits of the procedure and the sedation options and  ?                       risks were discussed with the patient. All questions  ?                       were answered and informed consent was obtained.  ?                       Patient identification and proposed procedure were  ?                       verified by the physician, the nurse, the  ?                       anesthesiologist, the anesthetist and the technician  ?                       in the pre-procedure area in the procedure room in the  ?                       endoscopy suite. Mental Status Examination: alert and  ?                       oriented. Airway Examination: normal oropharyngeal  ?                       airway and neck mobility. Respiratory Examination:  ?                       clear to auscultation. CV Examination: normal.  ?                       Prophylactic Antibiotics: The patient  does not require  ?                       prophylactic antibiotics. Prior Anticoagulants: The  ?                       patient has taken no previous anticoagulant or  ?                        antiplatelet agents. ASA Grade Assessment: II - A  ?                       patient with mild systemic disease. After reviewing  ?                       the risks and benefits, the patient was deemed in  ?                       satisfactory condition to undergo the procedure. The  ?                       anesthesia plan was to use general anesthesia.  ?                       Immediately prior to administration of medications,  ?                       the patient was re-assessed for adequacy to receive  ?                       sedatives. The heart rate, respiratory rate, oxygen  ?                       saturations, blood pressure, adequacy of pulmonary  ?                       ventilation, and response to care were monitored  ?                       throughout the procedure. The physical status of the  ?                       patient was re-assessed after the procedure. ?                       After obtaining informed consent, the colonoscope was  ?                       passed under direct vision. Throughout the procedure,  ?                       the patient's blood pressure, pulse, and oxygen  ?                       saturations were monitored continuously. The  ?                       Colonoscope was introduced through the anus and  ?  advanced to the the terminal ileum, with  ?                       identification of the appendiceal orifice and IC  ?                       valve. The colonoscopy was performed without  ?                       difficulty. The patient tolerated the procedure well.  ?                       The quality of the bowel preparation was evaluated  ?                       using the BBPS [facility] Bowel Preparation Scale) with  ?                       scores of: Right Colon = 3, Transverse Colon = 3 and  ?                       Left Colon = 3 (entire mucosa seen well with no  ?                       residual staining, small fragments of stool or opaque  ?                        liquid). The total BBPS score equals 9. ?Findings: ?     The perianal and digital rectal examinations were normal. Pertinent  ?     negatives include normal sphincter tone and no palpable rectal lesions. ?     The terminal ileum appeared normal. ?     Normal mucosa was found in the entire colon. Biopsies were taken with a  ?     cold forceps for histology. ?     The retroflexed view of the distal rectum and anal verge was normal and  ?     showed no anal or rectal abnormalities. ?Impression:            - The examined portion of the ileum was normal. ?                       - Normal mucosa in the entire examined colon. Biopsied. ?                       - The distal rectum and anal verge are normal on  ?                       retroflexion view. ?Recommendation:        - Discharge patient to home (with escort). ?                       - Resume previous diet today. ?                       - Continue present medications. ?                       -  Await pathology results. ?Procedure Code(s):     --- Professional --- ?                       [phone], Colonoscopy, flexible; with biopsy, single or  ?                       multiple ?Diagnosis Code(s):     --- Professional --- ?                       R10.84, Generalized abdominal pain ?CPT copyright 2019 American Medical Association. All rights reserved. ?The codes documented in this report are preliminary and upon coder review may  ?be revised to meet current compliance requirements. ?Dr. Ulyess Mort ?Jaymz Traywick Raeanne Gathers MD, MD ?09/09/2021 11:38:01 AM ?This report has been signed electronically. ?Number of Addenda: 0 ?Note Initiated On: 09/09/2021 10:48 AM ?Scope Withdrawal Time: 0 hours 10 minutes 46 seconds  ?Total Procedure Duration: 0 hours 14 minutes 49 seconds  ?Estimated Blood Loss:  Estimated blood loss: none. ?     [facility] ?

## 2021-09-09 NOTE — H&P (Signed)
?Cephas Darby, MD ?[address]  ?[address]  ?[place], [place] [address]  ?Main: [phone]  ?Fax: [phone] ?Pager: [phone] ? ?Primary Care Physician:  Juline Patch, MD ?Primary Gastroenterologist:  Dr. Cephas Darby ? ?Pre-Procedure History & Physical: ?HPI:  Autumn Brown is a 56 y.o. female is here for an endoscopy and colonoscopy. ?  ?Past Medical History:  ?Diagnosis Date  ? Abnormal uterine bleeding   ? Elevated LDL cholesterol level 2020  ? Hypercholesterolemia   ? Left bundle branch block   ? Urinary incontinence   ? ? ?Past Surgical History:  ?Procedure Laterality Date  ? APPENDECTOMY  1996  ? BREAST BIOPSY Left 08/06/2021  ? stereo biopsy/ coil clip/ path pending  ? ? ?Prior to Admission medications   ?Medication Sig Start Date End Date Taking? Authorizing Provider  ?albuterol (VENTOLIN HFA) 108 (90 Base) MCG/ACT inhaler Inhale 2 puffs into the lungs every 6 (six) hours as needed for wheezing or shortness of breath. 09/28/20  Yes Karma Ganja, NP  ?estradiol (VIVELLE-DOT) 0.05 MG/24HR patch Place 1 patch onto the skin 2 (two) times a week.   Yes [provider]  ?FLUoxetine (PROZAC) 20 MG capsule Take 20 mg by mouth daily. 05/02/21  Yes [provider]  ?loratadine (CLARITIN) 10 MG tablet Take 1 tablet (10 mg total) by mouth daily. ?Patient taking differently: Take 10 mg by mouth daily as needed for allergies. 05/02/21  Yes Juline Patch, MD  ?progesterone (PROMETRIUM) 100 MG capsule Take 1 capsule every day by oral route. 06/04/21  Yes   ?triamcinolone cream (KENALOG) 0.1 % Apply 1 application topically 2 (two) times daily. ?Patient taking differently: Apply 1 application. topically 2 (two) times daily as needed (irritation). 05/02/21  Yes Juline Patch, MD  ?COVID-19 At Home Antigen Test [facility] COVID-19 HOME TEST) KIT Use as directed 09/05/21   Letta Median, RPH  ? ? ?Allergies as of 08/20/2021 - Review Complete 08/20/2021  ?Allergen Reaction Noted  ? Honey bee  venom Itching, Shortness Of Breath, and Swelling 08/20/2021  ? Kiwi extract Swelling 08/20/2021  ? Pork-derived products  07/12/2021  ? ? ?Family History  ?Problem Relation Age of Onset  ? Diabetes Mother   ? Cancer Maternal Aunt   ?     colon cancer  ? Cancer Paternal Aunt   ?     cervical  ? Lung cancer Maternal Grandfather   ? Breast cancer Neg Hx   ? ? ?Social History  ? ?Socioeconomic History  ? Marital status: Married  ?  Spouse name: Not on file  ? Number of children: Not on file  ? Years of education: Not on file  ? Highest education level: Not on file  ?Occupational History  ? Not on file  ?Tobacco Use  ? Smoking status: Never  ?  Passive exposure: Never  ? Smokeless tobacco: Never  ?Vaping Use  ? Vaping Use: Never used  ?Substance and Sexual Activity  ? Alcohol use: No  ? Drug use: No  ? Sexual activity: Yes  ?  Partners: Male  ?  Birth control/protection: Post-menopausal  ?Other Topics Concern  ? Not on file  ?Social History Narrative  ? Not on file  ? ?Social Determinants of Health  ? ?Financial Resource Strain: Not on file  ?Food Insecurity: Not on file  ?Transportation Needs: Not on file  ?Physical Activity: Not on file  ?Stress: Not on file  ?Social Connections: Not on file  ?Intimate  Partner Violence: Not on file  ? ? ?Review of Systems: ?See HPI, otherwise negative ROS ? ?Physical Exam: ?BP 98/71   Pulse 68   Temp (!) 96.5 ?F (35.8 ?C) (Temporal)   Resp 20   Ht _0  (1.651 m)   Wt 64.9 kg   LMP 08/18/2019 (Exact Date)   SpO2 100%   BMI 23.80 kg/m?  ?General:   Alert,  pleasant and cooperative in NAD ?Head:  Normocephalic and atraumatic. ?Neck:  Supple; no masses or thyromegaly. ?Lungs:  Clear throughout to auscultation.    ?Heart:  Regular rate and rhythm. ?Abdomen:  Soft, nontender and nondistended. Normal bowel sounds, without guarding, and without rebound.   ?Neurologic:  Alert and  oriented x4;  grossly normal neurologically. ? ?Impression/Plan: ?Autumn Brown is here for an endoscopy  and colonoscopy to be performed for intermittent right upper quadrant pain, central abdominal pain associated with abdominal bloating and urgency ? ?Risks, benefits, limitations, and alternatives regarding  endoscopy and colonoscopy have been reviewed with the patient.  Questions have been answered.  All parties agreeable. ? ? ?Sherri Sear, MD  09/09/2021, 10:51 AM ?

## 2021-09-09 NOTE — Progress Notes (Signed)
Anesthesia Chart Review: SAME DAY WORK-UP ? Case: [id number] Date/Time: 09/10/21 0945  ? Procedure: RIGHT KNEE MEDIAL MENISCUS REPAIR VS PARTIAL MEDIAL MENISCECTOMY (Right: Knee)  ? Anesthesia type: Choice  ? Pre-op diagnosis: RIGHT KNEE MEDIAL MENISCUS TEAR AND CHONDROMALACIA  ? [facility]: [facility] ROOM 04 / [facility]  ? Surgeons: Georgeanna Harrison, MD  ? ?  ? ? ?DISCUSSION: Patient is a 56 year old female scheduled for the above procedure.  She is s/p EGD and colonoscopy at [facility] on 09/09/21 for evaluation of abdominal pain with bloating with known gallbladder polyps.  ? ?History includes never smoker, hypercholesterolemia, LBBB, AUB, appendectomy. 06/21/21 abdominal US showed multiple gallbladder polyps and findings suggestive of hepatis steatosis. She is being evaluated by GI and general surgery.  ? 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  ? ?Last cardiology follow-up with Dr. Nehemiah Massed was on 05/28/21.  She denied chest pain, PND, orthopnea, syncope, diaphoresis but did have mild dyspnea on exertion as well as "some pale hands when he raises her hands in the air as well as some dizziness and low blood pressure.  Majority of the symptoms appear to be benign in nature most consistent with positioning rather than primary cardiac abnormality although there are concerns abnormal EKG with left bundle branch block previously evaluated ..." He ordered a repeat echo which was done in March and showed showed normal LV/lRV function, LVEF > 55%, trivial MR. He was also planning to check H/H and TSH, but unsure if these have been done yet. ? ?She underwent endoscopy/colonoscopy procedures on 09/09/21. Biopsies are pending, but by initial impression primary findings appeared unremarkable except the esophageal mucosal changes were suspicious for eosinophilic esophagitis. ? ?If she has not had recent labs, then she will need updated labs on arrival for surgery. Her last EKG seen is also > 1  year ago, but has LBBB since at least 2018 and is followed by cardiology. ? ? ?VS: Ht _0  (1.651 m)   Wt 64.9 kg   LMP 08/18/2019 (Exact Date)   BMI 23.80 kg/m?  ?BP Readings from Last 3 Encounters:  ?09/09/21 (!) 94/53  ?09/06/21 116/74  ?08/20/21 (!) 103/54  ? ?Pulse Readings from Last 3 Encounters:  ?09/09/21 68  ?08/20/21 71  ?07/12/21 72  ?  ? ?PROVIDERS: ?Juline Patch, MD is PCP  ?- Serafina Royals, MD is cardiologist, established since 04/20/19 Jefm Bryant, [facility]) for chronic LBBB, palpitations, and DOE. Holter monitor showed SR with occasional PVCs. Echo showed normal LV systolic function with no evidence of significant valvular heart disease. ?- Gala Romney, MD is GYN ?- Olean Ree, MD is Education officer, environmental. Last visit 07/12/21. ?- Sherri Sear, MD is GI. 08/20/21 notes indicate that she was evaluated by Dr. Hampton Abbot for cholecystectomy that was originally scheduled for 08/27/21, but she preferred GI evaluation first before proceeding with surgery. ? ? ?LABS: For day of surgery as indicated.  ? ? ?OTHER:  ?Colonoscopy 09/09/21: ?Impression: ?- The examined portion of the ileum was normal. ?- Normal mucosa in the entire examined colon. Biopsied. ?- The distal rectum and anal verge are normal on retroflexion view. ? ?EGD 09/09/21: ?Impression: ?- Normal duodenal bulb and second portion of the duodenum. Biopsied. ?- Normal stomach. Biopsied. ?- Esophageal mucosal changes suspicious for eosinophilic esophagitis. Biopsied. ?- LA Grade A esophagitis with no bleeding. ? ? ?IMAGES: ?MRI Right Knee 08/05/21: ?IMPRESSION: ?1. Nondisplaced subchondral fracture at the posterior medial tibial ?plateau, possibly  subchondral insufficiency fracture or ?posttraumatic. Associated surrounding bone marrow edema/contusion. ?2. Complex tearing of the posterior horn and root of the medial ?meniscus. ?3. Horizontal tear centered in the body of the lateral meniscus. ?4. Mild bone marrow edema/contusion at the medial femoral  condyle. ?5. Joint effusion and small popliteal cyst. ? ?[place] Abd 06/21/21: ?IMPRESSION: ?1. Multiple gallbladder polyps, largest 1.3 cm. The largest polyp ?described on 01/29/2017 outside abdominal sonogram report was 0.9 ?cm. Findings suggest mild growth. Surgical consultation advised. ?This recommendation follows ACR consensus guidelines: White Paper of ?the ACR Incidental Findings Committee II on Gallbladder and Biliary ?Findings. J Am Coll Radiol 2013:;10:953-956. ?2. No biliary ductal dilatation. ?3. Diffusely mildly echogenic liver parenchyma, a nonspecific ?finding most commonly due to diffuse hepatic steatosis, with hepatic ?fibrosis not excluded. Suggest correlation with liver function ?tests. Consider hepatic elastography for further liver fibrosis risk ?stratification, as clinically warranted. ?  ? ?EKG: For day of surgery as indicated.  ? ? ?CV: ?Echo 07/05/21 ([facility]): ?INTERPRETATION  ?NORMAL LEFT VENTRICULAR SYSTOLIC FUNCTION  ?NORMAL RIGHT VENTRICULAR SYSTOLIC FUNCTION  ?TRIVIAL REGURGITATION NOTED (trivial MR)  ?NO VALVULAR STENOSIS  ?ESTIMATED LVEF >55% (CALCULATED: 72.5%)  ?GLS: -26.5  ? ?Holter monitor 04/20/19 ([facility]): Per 05/09/19 [facility] notation by Dr. Nehemiah Massed,  ?"Baseline normal sinus rhythm with nonspecific interventricular conduction defect with a maximum heart rate of 129 bpm minimum of 49 beats per an average of 74 bpm. There is occasional preventricular contraction. There is no evidence of significant progressive heart block or supraventricular tachycardia." ? ? ?Past Medical History:  ?Diagnosis Date  ? Abnormal uterine bleeding   ? Elevated LDL cholesterol level 2020  ? Hypercholesterolemia   ? Left bundle branch block   ? Urinary incontinence   ? ? ?Past Surgical History:  ?Procedure Laterality Date  ? APPENDECTOMY  1996  ? BREAST BIOPSY Left 08/06/2021  ? stereo biopsy/ coil clip/ path pending  ? ? ?MEDICATIONS: ?No current facility-administered medications for this encounter.  ? ?  albuterol (VENTOLIN HFA) 108 (90 Base) MCG/ACT inhaler  ? estradiol (VIVELLE-DOT) 0.05 MG/24HR patch  ? FLUoxetine (PROZAC) 20 MG capsule  ? loratadine (CLARITIN) 10 MG tablet  ? progesterone (PROMETRIUM) 100 MG capsule  ? triamcinolone cream (KENALOG) 0.1 %  ? COVID-19 At Home Antigen Test [facility] COVID-19 HOME TEST) KIT  ? ? 0.9 %  sodium chloride infusion  ? ? ?Myra Gianotti, PA-C ?Surgical Short Stay/Anesthesiology ?[facility] Phone [phone] ?[facility] Phone [phone] ?09/09/2021 12:59 PM ? ? ? ? ? ? ? ?

## 2021-09-09 NOTE — Transfer of Care (Signed)
Immediate Anesthesia Transfer of Care Note ? ?Patient: Autumn Brown ? ?Procedure(s) Performed: COLONOSCOPY WITH PROPOFOL ?ESOPHAGOGASTRODUODENOSCOPY (EGD) WITH PROPOFOL ? ?Patient Location: PACU and Endoscopy Unit ? ?Anesthesia Type:General ? ?Level of Consciousness: drowsy and patient cooperative ? ?Airway & Oxygen Therapy: Patient Spontanous Breathing ? ?Post-op Assessment: Report given to RN and Post -op Vital signs reviewed and stable ? ?Post vital signs: Reviewed and stable ? ?Last Vitals:  ?Vitals Value Taken Time  ?BP 99/70 09/09/21 1145  ?Temp    ?Pulse 114 09/09/21 1145  ?Resp 16 09/09/21 1145  ?SpO2 98 % 09/09/21 1145  ?Vitals shown include unvalidated device data. ? ?Last Pain:  ?Vitals:  ? 09/09/21 1002  ?TempSrc: Temporal  ?PainSc: 0-No pain  ?   ? ?  ? ?Complications: No notable events documented. ?

## 2021-09-09 NOTE — Telephone Encounter (Signed)
Placed referral  

## 2021-09-09 NOTE — Anesthesia Preprocedure Evaluation (Addendum)
Anesthesia Evaluation  ?Patient identified by MRN, date of birth, ID band ?Patient awake ? ? ? ?Reviewed: ?Allergy & Precautions, NPO status , Patient's Chart, lab work & pertinent test results ? ?History of Anesthesia Complications ?Negative for: history of anesthetic complications ? ?Airway ?Mallampati: I ? ?TM Distance: >3 FB ?Neck ROM: Full ? ? ? Dental ?no notable dental hx. ? ?  ?Pulmonary ?asthma ,  ?  ?Pulmonary exam normal ? ? ? ? ? ? ? Cardiovascular ?Normal cardiovascular exam ? ?Echo 07/05/21 ([facility]): ?INTERPRETATION  ?NORMAL LEFT VENTRICULAR SYSTOLIC FUNCTION  ?NORMAL RIGHT VENTRICULAR SYSTOLIC FUNCTION  ?TRIVIAL REGURGITATION NOTED (trivial MR)  ?NO VALVULAR STENOSIS  ?ESTIMATED LVEF >55% (CALCULATED: 72.5%)  ?GLS: -26.5  ?  ?Neuro/Psych ?Depression negative neurological ROS ?   ? GI/Hepatic ?negative GI ROS, Neg liver ROS,   ?Endo/Other  ?negative endocrine ROS ? Renal/GU ?negative Renal ROS  ?negative genitourinary ?  ?Musculoskeletal ? ?(+) Arthritis , RIGHT KNEE MEDIAL MENISCUS TEAR AND CHONDROMALACIA  ? Abdominal ?  ?Peds ? Hematology ?negative hematology ROS ?(+)   ?Anesthesia Other Findings ?Day of surgery medications reviewed with patient. ? Reproductive/Obstetrics ?negative OB ROS ? ?  ? ? ? ? ? ? ? ? ? ? ? ? ? ?  ?  ? ? ? ? ? ? ?Anesthesia Physical ?Anesthesia Plan ? ?ASA: 2 ? ?Anesthesia Plan: General  ? ?Post-op Pain Management: Tylenol PO (pre-op)* and Regional block*  ? ?Induction:  ? ?PONV Risk Score and Plan: 3 and Treatment may vary due to age or medical condition, Midazolam, Dexamethasone and Ondansetron ? ?Airway Management Planned: LMA ? ?Additional Equipment: None ? ?Intra-op Plan:  ? ?Post-operative Plan: Extubation in OR ? ?Informed Consent: I have reviewed the patients History and Physical, chart, labs and discussed the procedure including the risks, benefits and alternatives for the proposed anesthesia with the patient or authorized  representative who has indicated his/her understanding and acceptance.  ? ? ? ?Dental advisory given ? ?Plan Discussed with: CRNA ? ?Anesthesia Plan Comments: (PAT note written 09/09/2021 by Myra Gianotti, PA-C. ?)  ? ? ? ? ?Anesthesia Quick Evaluation ? ?

## 2021-09-10 ENCOUNTER — Ambulatory Visit (HOSPITAL_COMMUNITY): Payer: PRIVATE HEALTH INSURANCE | Admitting: Vascular Surgery

## 2021-09-10 ENCOUNTER — Other Ambulatory Visit (HOSPITAL_COMMUNITY): Payer: Self-pay

## 2021-09-10 ENCOUNTER — Encounter (HOSPITAL_COMMUNITY): Payer: Self-pay | Admitting: Orthopedic Surgery

## 2021-09-10 ENCOUNTER — Ambulatory Visit (HOSPITAL_BASED_OUTPATIENT_CLINIC_OR_DEPARTMENT_OTHER): Payer: PRIVATE HEALTH INSURANCE | Admitting: Vascular Surgery

## 2021-09-10 ENCOUNTER — Ambulatory Visit (HOSPITAL_COMMUNITY)
Admission: RE | Admit: 2021-09-10 | Discharge: 2021-09-10 | Disposition: A | Discharge status: Home / Self Care | Payer: PRIVATE HEALTH INSURANCE | Source: Ambulatory Visit | Attending: Orthopedic Surgery | Admitting: Orthopedic Surgery

## 2021-09-10 ENCOUNTER — Encounter (HOSPITAL_COMMUNITY)
Admission: RE | Disposition: A | Discharge status: Home / Self Care | Payer: Self-pay | Source: Ambulatory Visit | Attending: Orthopedic Surgery

## 2021-09-10 ENCOUNTER — Other Ambulatory Visit: Payer: Self-pay

## 2021-09-10 ENCOUNTER — Encounter (HOSPITAL_COMMUNITY): Payer: No Typology Code available for payment source

## 2021-09-10 DIAGNOSIS — F32A Depression, unspecified: Secondary | ICD-10-CM | POA: Insufficient documentation

## 2021-09-10 DIAGNOSIS — M94261 Chondromalacia, right knee: Secondary | ICD-10-CM | POA: Insufficient documentation

## 2021-09-10 DIAGNOSIS — J45909 Unspecified asthma, uncomplicated: Secondary | ICD-10-CM | POA: Diagnosis not present

## 2021-09-10 DIAGNOSIS — S83231A Complex tear of medial meniscus, current injury, right knee, initial encounter: Secondary | ICD-10-CM

## 2021-09-10 DIAGNOSIS — X58XXXA Exposure to other specified factors, initial encounter: Secondary | ICD-10-CM | POA: Insufficient documentation

## 2021-09-10 HISTORY — PX: KNEE ARTHROSCOPY: SHX127

## 2021-09-10 LAB — BASIC METABOLIC PANEL
Anion gap: 7 (ref 5–15)
BUN: 9 mg/dL (ref 6–20)
CO2: 24 mmol/L (ref 22–32)
Calcium: 8.8 mg/dL — ABNORMAL LOW (ref 8.9–10.3)
Chloride: 109 mmol/L (ref 98–111)
Creatinine, Ser: 0.59 mg/dL (ref 0.44–1.00)
GFR, Estimated: >60 mL/min (ref >60)
Glucose, Bld: 94 mg/dL (ref 70–99)
Potassium: 3.8 mmol/L (ref 3.5–5.1)
Sodium: 140 mmol/L (ref 135–145)

## 2021-09-10 LAB — CBC
HCT: 38.3 % (ref 36.0–46.0)
Hemoglobin: 12.3 g/dL (ref 12.0–15.0)
MCH: 29.9 pg (ref 26.0–34.0)
MCHC: 32.1 g/dL (ref 30.0–36.0)
MCV: 93.2 fL (ref 80.0–100.0)
Platelets: 173 10*3/uL (ref 150–400)
RBC: 4.11 MIL/uL (ref 3.87–5.11)
RDW: 13 % (ref 11.5–15.5)
WBC: 3.9 10*3/uL — ABNORMAL LOW (ref 4.0–10.5)
nRBC: 0 % (ref 0.0–0.2)

## 2021-09-10 SURGERY — ARTHROSCOPY, KNEE
Anesthesia: General | Site: Knee | Laterality: Right

## 2021-09-10 MED ORDER — PROPOFOL 10 MG/ML IV BOLUS
INTRAVENOUS | Status: AC
  Filled 2021-09-10: qty 20

## 2021-09-10 MED ORDER — FENTANYL CITRATE (PF) 100 MCG/2ML IJ SOLN: 50.0000 ug | Freq: Once | INTRAMUSCULAR | Status: AC

## 2021-09-10 MED ORDER — BUPIVACAINE-EPINEPHRINE (PF) 0.5% -1:200000 IJ SOLN
INTRAMUSCULAR | Status: DC | PRN
  Administered 2021-09-10: 15 mL via PERINEURAL

## 2021-09-10 MED ORDER — ACETAMINOPHEN 500 MG PO TABS
1000.0000 mg | ORAL_TABLET | Freq: Once | ORAL | Status: AC
  Administered 2021-09-10: 1000 mg via ORAL
  Filled 2021-09-10: qty 2

## 2021-09-10 MED ORDER — LIDOCAINE 2% (20 MG/ML) 5 ML SYRINGE
INTRAMUSCULAR | Status: DC | PRN
  Administered 2021-09-10: 60 mg via INTRAVENOUS

## 2021-09-10 MED ORDER — ONDANSETRON HCL 4 MG/2ML IJ SOLN
INTRAMUSCULAR | Status: DC | PRN
  Administered 2021-09-10: 4 mg via INTRAVENOUS

## 2021-09-10 MED ORDER — CLONIDINE HCL (ANALGESIA) 100 MCG/ML EP SOLN
EPIDURAL | Status: DC | PRN
  Administered 2021-09-10: 100 ug

## 2021-09-10 MED ORDER — LIDOCAINE 2% (20 MG/ML) 5 ML SYRINGE
INTRAMUSCULAR | Status: AC
  Filled 2021-09-10: qty 5

## 2021-09-10 MED ORDER — CHLORHEXIDINE GLUCONATE 0.12 % MT SOLN
15.0000 mL | Freq: Once | OROMUCOSAL | Status: AC
  Administered 2021-09-10: 15 mL via OROMUCOSAL
  Filled 2021-09-10: qty 15

## 2021-09-10 MED ORDER — EPINEPHRINE PF 1 MG/ML IJ SOLN
INTRAMUSCULAR | Status: AC
  Filled 2021-09-10: qty 4

## 2021-09-10 MED ORDER — AMISULPRIDE (ANTIEMETIC) 5 MG/2ML IV SOLN: 10.0000 mg | Freq: Once | INTRAVENOUS | Status: DC | PRN

## 2021-09-10 MED ORDER — GLYCOPYRROLATE PF 0.2 MG/ML IJ SOSY
PREFILLED_SYRINGE | INTRAMUSCULAR | Status: DC | PRN
  Administered 2021-09-10: .2 mg via INTRAVENOUS

## 2021-09-10 MED ORDER — ONDANSETRON HCL 4 MG/2ML IJ SOLN
INTRAMUSCULAR | Status: AC
  Filled 2021-09-10: qty 2

## 2021-09-10 MED ORDER — FENTANYL CITRATE (PF) 250 MCG/5ML IJ SOLN
INTRAMUSCULAR | Status: AC
  Filled 2021-09-10: qty 5

## 2021-09-10 MED ORDER — PROPOFOL 10 MG/ML IV BOLUS
INTRAVENOUS | Status: DC | PRN
  Administered 2021-09-10: 100 mg via INTRAVENOUS
  Administered 2021-09-10: 40 mg via INTRAVENOUS

## 2021-09-10 MED ORDER — OXYCODONE HCL 5 MG PO TABS
5.0000 mg | ORAL_TABLET | Freq: Four times a day (QID) | ORAL | 0 refills | Status: DC | PRN
  Filled 2021-09-10: qty 30, 8d supply, fill #0

## 2021-09-10 MED ORDER — PHENYLEPHRINE 80 MCG/ML (10ML) SYRINGE FOR IV PUSH (FOR BLOOD PRESSURE SUPPORT)
PREFILLED_SYRINGE | INTRAVENOUS | Status: AC
  Filled 2021-09-10: qty 10

## 2021-09-10 MED ORDER — FENTANYL CITRATE (PF) 100 MCG/2ML IJ SOLN
25.0000 ug | INTRAMUSCULAR | Status: DC | PRN
  Administered 2021-09-10: 50 ug via INTRAVENOUS

## 2021-09-10 MED ORDER — CEFAZOLIN SODIUM-DEXTROSE 2-4 GM/100ML-% IV SOLN
2.0000 g | INTRAVENOUS | Status: AC
Start: 2021-09-10 — End: 2021-09-10
  Administered 2021-09-10: 2 g via INTRAVENOUS
  Filled 2021-09-10: qty 100

## 2021-09-10 MED ORDER — FENTANYL CITRATE (PF) 100 MCG/2ML IJ SOLN
INTRAMUSCULAR | Status: AC
  Administered 2021-09-10: 50 ug via INTRAVENOUS
  Filled 2021-09-10: qty 2

## 2021-09-10 MED ORDER — EPHEDRINE 5 MG/ML INJ
INTRAVENOUS | Status: AC
  Filled 2021-09-10: qty 5

## 2021-09-10 MED ORDER — MIDAZOLAM HCL 2 MG/2ML IJ SOLN
INTRAMUSCULAR | Status: AC
  Administered 2021-09-10: 1 mg via INTRAVENOUS
  Filled 2021-09-10: qty 2

## 2021-09-10 MED ORDER — PHENYLEPHRINE 80 MCG/ML (10ML) SYRINGE FOR IV PUSH (FOR BLOOD PRESSURE SUPPORT)
PREFILLED_SYRINGE | INTRAVENOUS | Status: DC | PRN
  Administered 2021-09-10 (×2): 40 ug via INTRAVENOUS
  Administered 2021-09-10: 80 ug via INTRAVENOUS
  Administered 2021-09-10: 40 ug via INTRAVENOUS
  Administered 2021-09-10: 80 ug via INTRAVENOUS
  Administered 2021-09-10 (×2): 40 ug via INTRAVENOUS

## 2021-09-10 MED ORDER — LACTATED RINGERS IV SOLN: INTRAVENOUS | Status: DC

## 2021-09-10 MED ORDER — OXYCODONE HCL 5 MG PO TABS
ORAL_TABLET | ORAL | Status: AC
  Filled 2021-09-10: qty 1

## 2021-09-10 MED ORDER — ORAL CARE MOUTH RINSE: 15.0000 mL | Freq: Once | OROMUCOSAL | Status: AC

## 2021-09-10 MED ORDER — MIDAZOLAM HCL 2 MG/2ML IJ SOLN: 1.0000 mg | Freq: Once | INTRAMUSCULAR | Status: AC

## 2021-09-10 MED ORDER — CEFAZOLIN SODIUM-DEXTROSE 2-4 GM/100ML-% IV SOLN: 2.0000 g | INTRAVENOUS | Status: DC

## 2021-09-10 MED ORDER — SODIUM CHLORIDE 0.9 % IR SOLN
Status: DC | PRN
Start: 2021-09-10 — End: 2021-09-10
  Administered 2021-09-10 (×3): 3000 mL

## 2021-09-10 MED ORDER — DEXAMETHASONE SODIUM PHOSPHATE 4 MG/ML IJ SOLN
INTRAMUSCULAR | Status: DC | PRN
  Administered 2021-09-10: 5 mg via INTRAVENOUS

## 2021-09-10 MED ORDER — EPINEPHRINE PF 1 MG/ML IJ SOLN
INTRAMUSCULAR | Status: DC | PRN
  Administered 2021-09-10 (×3): 1 mg

## 2021-09-10 MED ORDER — DEXAMETHASONE SODIUM PHOSPHATE 10 MG/ML IJ SOLN
INTRAMUSCULAR | Status: AC
  Filled 2021-09-10: qty 1

## 2021-09-10 MED ORDER — PROMETHAZINE HCL 12.5 MG PO TABS
12.5000 mg | ORAL_TABLET | Freq: Four times a day (QID) | ORAL | 0 refills | Status: DC | PRN
  Filled 2021-09-10: qty 30, 8d supply, fill #0

## 2021-09-10 MED ORDER — OXYCODONE HCL 5 MG PO TABS
5.0000 mg | ORAL_TABLET | Freq: Once | ORAL | Status: AC | PRN
  Administered 2021-09-10: 5 mg via ORAL

## 2021-09-10 MED ORDER — FENTANYL CITRATE (PF) 100 MCG/2ML IJ SOLN
INTRAMUSCULAR | Status: AC
  Filled 2021-09-10: qty 2

## 2021-09-10 MED ORDER — MIDAZOLAM HCL 2 MG/2ML IJ SOLN
INTRAMUSCULAR | Status: AC
  Filled 2021-09-10: qty 2

## 2021-09-10 MED ORDER — OXYCODONE HCL 5 MG/5ML PO SOLN: 5.0000 mg | Freq: Once | ORAL | Status: AC | PRN

## 2021-09-10 MED ORDER — EPHEDRINE SULFATE-NACL 50-0.9 MG/10ML-% IV SOSY
PREFILLED_SYRINGE | INTRAVENOUS | Status: DC | PRN
  Administered 2021-09-10 (×5): 5 mg via INTRAVENOUS

## 2021-09-10 SURGICAL SUPPLY — 59 items
ALCOHOL 70% 16 OZ (MISCELLANEOUS) ×2 IMPLANT
BAG COUNTER SPONGE SURGICOUNT (BAG) ×2 IMPLANT
BANDAGE ESMARK 6X9 LF (GAUZE/BANDAGES/DRESSINGS) IMPLANT
BLADE CLIPPER SURG (BLADE) IMPLANT
BLADE CUTTER GATOR 3.5 (BLADE) ×2 IMPLANT
BLADE SHAVER BONE 5.0X13 (MISCELLANEOUS) ×1 IMPLANT
BNDG COHESIVE 1X5 TAN STRL LF (GAUZE/BANDAGES/DRESSINGS) ×1 IMPLANT
BNDG ELASTIC 6X5.8 VLCR STR LF (GAUZE/BANDAGES/DRESSINGS) ×1 IMPLANT
BNDG ESMARK 6X9 LF (GAUZE/BANDAGES/DRESSINGS)
COVER SURGICAL LIGHT HANDLE (MISCELLANEOUS) ×2 IMPLANT
CUFF TOURN SGL QUICK 34 (TOURNIQUET CUFF)
CUFF TOURN SGL QUICK 42 (TOURNIQUET CUFF) IMPLANT
CUFF TRNQT CYL 34X4.125X (TOURNIQUET CUFF) IMPLANT
DISSECTOR 4.0MMX13CM CVD (MISCELLANEOUS) ×1 IMPLANT
DRAPE ARTHROSCOPY W/POUCH 114 (DRAPES) ×2 IMPLANT
DRAPE HALF SHEET 40X57 (DRAPES) IMPLANT
DRAPE INCISE IOBAN 66X45 STRL (DRAPES) IMPLANT
DRAPE U-SHAPE 47X51 STRL (DRAPES) ×2 IMPLANT
DURAPREP 26ML APPLICATOR (WOUND CARE) ×2 IMPLANT
GAUZE PAD ABD 8X10 STRL (GAUZE/BANDAGES/DRESSINGS) ×1 IMPLANT
GAUZE SPONGE 4X4 12PLY STRL (GAUZE/BANDAGES/DRESSINGS) ×1 IMPLANT
GAUZE XEROFORM 1X8 LF (GAUZE/BANDAGES/DRESSINGS) IMPLANT
GLOVE BIOGEL PI IND STRL 7.0 (GLOVE) ×1 IMPLANT
GLOVE BIOGEL PI IND STRL 8 (GLOVE) ×1 IMPLANT
GLOVE BIOGEL PI INDICATOR 7.0 (GLOVE) ×1
GLOVE BIOGEL PI INDICATOR 8 (GLOVE) ×1
GLOVE ECLIPSE 7.0 STRL STRAW (GLOVE) ×2 IMPLANT
GLOVE ECLIPSE 8.0 STRL XLNG CF (GLOVE) ×2 IMPLANT
GOWN STRL REUS W/ TWL LRG LVL3 (GOWN DISPOSABLE) ×2 IMPLANT
GOWN STRL REUS W/ TWL XL LVL3 (GOWN DISPOSABLE) ×1 IMPLANT
GOWN STRL REUS W/TWL LRG LVL3 (GOWN DISPOSABLE) ×2
GOWN STRL REUS W/TWL XL LVL3 (GOWN DISPOSABLE) ×1
KIT BASIN OR (CUSTOM PROCEDURE TRAY) ×2 IMPLANT
KIT TURNOVER KIT B (KITS) ×2 IMPLANT
MANIFOLD NEPTUNE II (INSTRUMENTS) IMPLANT
NDL 18GX1X1/2 (RX/OR ONLY) (NEEDLE) IMPLANT
NEEDLE 18GX1X1/2 (RX/OR ONLY) (NEEDLE) IMPLANT
NEEDLE 22X1 1/2 (OR ONLY) (NEEDLE) ×2 IMPLANT
NS IRRIG 1000ML POUR BTL (IV SOLUTION) IMPLANT
PACK ARTHROSCOPY DSU (CUSTOM PROCEDURE TRAY) ×2 IMPLANT
PAD ARMBOARD 7.5X6 YLW CONV (MISCELLANEOUS) ×4 IMPLANT
PADDING CAST ABS 4INX4YD NS (CAST SUPPLIES) ×1
PADDING CAST ABS COTTON 4X4 ST (CAST SUPPLIES) IMPLANT
PADDING CAST COTTON 6X4 STRL (CAST SUPPLIES) ×2 IMPLANT
PORT APPOLLO RF 90DEGREE MULTI (SURGICAL WAND) ×2 IMPLANT
SOL PREP POV-IOD 4OZ 10% (MISCELLANEOUS) ×2 IMPLANT
SPONGE T-LAP 4X18 ~~LOC~~+RFID (SPONGE) ×2 IMPLANT
SUT ETHILON 3 0 PS 1 (SUTURE) IMPLANT
SUT MENISCAL KIT (KITS) IMPLANT
SUT MON AB 2-0 CT1 36 (SUTURE) ×1 IMPLANT
SYR 20ML ECCENTRIC (SYRINGE) ×2 IMPLANT
SYR CONTROL 10ML LL (SYRINGE) IMPLANT
SYR TB 1ML LUER SLIP (SYRINGE) ×2 IMPLANT
TOWEL GREEN STERILE (TOWEL DISPOSABLE) ×2 IMPLANT
TOWEL GREEN STERILE FF (TOWEL DISPOSABLE) ×2 IMPLANT
TUBE CONNECTING 12X1/4 (SUCTIONS) ×2 IMPLANT
TUBING ARTHROSCOPY IRRIG 16FT (MISCELLANEOUS) ×2 IMPLANT
WAND 90 DEG TURBOVAC W/CORD (SURGICAL WAND) IMPLANT
WATER STERILE IRR 1000ML POUR (IV SOLUTION) ×2 IMPLANT

## 2021-09-10 NOTE — Op Note (Signed)
OPERATIVE NOTE ? ?Autumn Brown ?female ?56 y.o. ?09/10/2021 ? ?PREOPERATIVE DIAGNOSIS: ?Right medial meniscus posterior root tear ? ?POSTOPERATIVE DIAGNOSIS: ?Right medial meniscus complex tear posterior horn and body [id number] ? ?PROCEDURE(S): ?Right knee arthroscopy with partial medial meniscectomy [id number]) ? ?SURGEON: Georgeanna Harrison, M.D. ? ?ASSISTANT(S): Izola Price, RNFA ? ?ANESTHESIA: Choice ? ?FINDINGS: ?Preoperative Examination: ?RLE: Trace knee effusion, with range of motion 0 degrees to 140 degrees without pain.  There is tenderness along the medial joint line.  Positive medial McMurray, and pain with hyperflexion/Steinmann test.  Normal straight leg raise against manual resistance.  Stable Lachman.  Stable anterior drawer and posterior drawer.  Stable to varus and valgus stress in 30 degrees and 0 degrees.  Normal ankle dorsiflexion, plantarflexion, and EHL strength and function.  Sensation intact light touch in the superficial peroneal, deep peroneal, and tibial distributions.  Normal DP and PT pulses.  Warm and well-perfused distally. ? ?Operative Findings: ?Arthroscopic examination of the knee revealed the following: ?Medial compartment: Complex tearing of the posterior horn and body of the medial meniscus.  Medial meniscus posterior root intact.  Poor tissue quality and insufficient tissue remaining for centralization procedure.  Area of grade 1-3 change along the lateral aspect of the medial femoral condyle.  Softening of medial tibial plateau cartilage. ?Intercondylar notch: ACL intact and stable to probing.  PCL intact and stable to probing. ?Posterior recess: Medial meniscus ramp intact.  Medial meniscus posterior horn intact from this view without signs of injury.  No synovitis.  No loose bodies. ?Lateral compartment: Lateral meniscus intact and stable to probing.  Softening of lateral tibial plateau cartilage.  Lateral femoral condyle cartilage intact. ?Lateral gutter: No synovitis.  No loose  bodies. ?Patellofemoral compartment: Grade 1-2 changes on medial facet of the patella, but otherwise cartilage intact. ?Suprapatellar pouch: No synovitis.  No loose bodies. ?Medial gutter: No synovitis.  No loose bodies. ? ?IMPLANTS: ?* No implants in log * ? ?INDICATIONS:  ?The patient is a 56 y.o. female who sustained a Worker's Compensation related injury to her right knee.  She was initially treated by my partner Dr. Rhona Raider for a knee contusion, but ultimately due to the persistence of medial joint line pain and symptoms including painful clicking and snapping, an MRI was obtained.  This was concerning for a posterior medial meniscus root tear.  She was referred to me for further discussion of treatment options, as my partner does not perform root repairs.  Preoperative imaging was fully discussed with the patient, and both nonoperative and operative treatments were discussed.  Given the concern for a root tear without significant degenerative change in the involved compartment, she was interested in proceeding with knee arthroscopy and possible root repair if indicated.  She did understand that findings at arthroscopy may be different from findings on the MRI, and that repair might not be possible or indicated.  She understood the risks, benefits and alternatives to surgery which include but are not limited to bleeding, wound healing complications, infection, damage to surrounding structures, persistent pain, stiffness, lack of improvement, potential for subsequent arthritis or worsening of pre-existing arthritis, and need for further surgery, as well as complications related to anesthesia, cardiovascular complications, and death.  She also understood the potential for continued pain, and that there were no guarantees of acceptable outcome.  After weighing these risks the patient opted to proceed with surgery. ? ?TECHNIQUE: ?Patient was identified in the preoperative holding area.  The right knee was marked  by myself.  Consent  was signed by myself and the patient.  Adductor cannal and popliteal blocks were performed by anesthesia in the preoperative holding area.  Patient was taken to the operative suite and placed supine on the operative table.  Anesthesia was induced by the anesthesia team.  The patient was positioned appropriately for the procedure and all bony prominences were well padded.  A non-sterile thigh tourniquet was placed on the operative extremity.  Preoperative antibiotics were given. The extremity was prepped and draped in the usual sterile fashion and surgical timeout was performed.  Tourniquet was not inflated during the entire procedure.  Standard lateral infrapatellar portal established.  Medial infrapatellar portal established under arthroscopic visualization using spinal needle localization.  Diagnostic arthroscopy was performed, findings noted.  Full arthroscopic visualization of the posterior horn and body of the medial meniscus was difficult, so a percutaneous lengthening of the MCL was carefully performed using a spinal needle.  Complex tearing was noted extending from the posterior horn through the body of the medial meniscus.  This tissue was debrided with the arthroscopic sucker shaver to stable meniscal tissue.  The medial meniscus including posterior root and meniscocapsular attachments were fully evaluated and thoroughly probed.  The meniscal root was intact to the tibia without any obvious signs of tear or injury.  Along the undersurface of the remnant meniscus, there were areas of poor tissue quality which did not appear to be amenable to suture fixation.  Consideration was given to centralization with tibial based anchors; however, the location for the centralization was identified with spinal needle localization, and there was less than 50% of meniscal remnant at the body and body/posterior horn junction where centralization stitches would be placed.  Therefore it was not felt that  proceeding with centralization would be advisable or in the patient's best interest given the tissue quality and the amount of meniscus remaining in this area.  The knee was copiously irrigated and suctioned of all fluid and debris.  Portal sites were closed with simple inverted interrupted 2-0 Monocryl sutures and sealed with Steri-Strips.  Dry sterile dressing of 4 x 4's, ABD, and Webril was placed and secured with an Ace bandage.  Patient was awakened from anesthesia and transferred to PACU in stable condition.  She tolerated procedure well.  There were no complications. ? ?POST OPERATIVE INSTRUCTIONS: ?Mobility: Weightbearing as tolerated with crutches as needed for ambulation. ?Pain control: Continue to wean/titrate to appropriate oral regimen ?DVT Prophylaxis: 81 mg aspirin twice daily for 2 weeks. ?RLE: Weightbearing as tolerated ?Disposition: Home ?Dressing care: Remove brace (if present), Ace wrap (if present), and gauze/tape dressing 48 hours after surgery.  Do not remove paper tapes (Steri-Strips) or any suture material.  Steri-Strips may eventually fall off on their own.  Reapply fresh gauze/tape.  Reapply Ace wrap (if present), wrapping lightly over wounds.  Reapply brace if present. ?Follow-up: Please call [facility] [phone]) to schedule follow-op appointment for 2 weeks after surgery. ? ?TOURNIQUET TIME: N/A ? ?BLOOD LOSS: 15 mL ?        ?DRAINS: none ?        ?SPECIMEN: none ?      ?COMPLICATIONS:  * No complications entered in OR log * ?        ?DISPOSITION: PACU - hemodynamically stable. ?        ?CONDITION: stable  ? ?Georgeanna Harrison M.D. ?Orthopaedic Surgery ?[facility] ? ? ?Portions of the record have been created with voice recognition software.  Grammatical and punctuation errors, random word insertions, wrong-word or "sound-a-like" substitutions, pronoun errors (inaccuracies and/or substitutions), and/or incomplete sentences  may have occurred due to the inherent limitations of voice recognition software.  Not all errors are caught or corrected.  Although every attempt is made to root out erroneous and incomplete transcription

## 2021-09-10 NOTE — Anesthesia Procedure Notes (Signed)
Anesthesia Regional Block: Adductor canal block  ? ?Pre-Anesthetic Checklist: , timeout performed,  Correct Patient, Correct Site, Correct Laterality,  Correct Procedure, Correct Position, site marked,  Risks and benefits discussed,  Pre-op evaluation,  At surgeon's request and post-op pain management ? ?Laterality: Right ? ?Prep: Maximum Sterile Barrier Precautions used, chloraprep     ?  ?Needles:  ?Injection technique: Single-shot ? ?Needle Type: Echogenic Stimulator Needle   ? ? ?Needle Length: 9cm  ?Needle Gauge: 22  ? ? ? ?Additional Needles: ? ? ?Procedures:,,,, ultrasound used (permanent image in chart),,    ?Narrative:  ?Start time: 09/10/2021 9:40 AM ?End time: 09/10/2021 9:43 AM ?Injection made incrementally with aspirations every 5 mL. ? ?Performed by: Personally  ?Anesthesiologist: Kaylyn Layer, MD ? ?Additional Notes: ?Risks, benefits, and alternative discussed. Patient gave consent for procedure. Patient prepped and draped in sterile fashion. Sedation administered, patient remains easily responsive to voice. Relevant anatomy identified with ultrasound guidance. Local anesthetic given in 5cc increments with no signs or symptoms of intravascular injection. No pain or paraesthesias with injection. Patient monitored throughout procedure with signs of LAST or immediate complications. Tolerated well. Ultrasound image placed in chart.  ?Amalia Greenhouse, MD ? ? ? ? ? ? ?

## 2021-09-10 NOTE — H&P (Addendum)
PREOPERATIVE H&P ? ?HPI: ?Autumn Brown is a 56 y.o. female who has presented today for surgery, with the diagnosis of right medial meniscus root tear, which was the result of a Worker's Compensation related knee injury.  She had initially been treated conservatively by my partner Dr. Earl Many for a knee contusion, but ultimately an MRI was obtained given the persistence of medial joint line pain and intermittent swelling which did not resolve with conservative treatment, and there was concern for a medial meniscus root tear.  She was then referred to me for further evaluation and treatment.  The various methods of treatment have been discussed with the patient and family.  After consideration of risks, benefits, and other options for treatment, the patient has consented to [place] as a surgical intervention.  The patient's history has been reviewed, patient examined, no change in status, stable for surgery.  I have reviewed the patient's chart and labs.  Questions were answered to the patient's satisfaction.   ? ?PMH: ?Past Medical History:  ?Diagnosis Date  ? Abnormal uterine bleeding   ? Elevated LDL cholesterol level 2020  ? Hypercholesterolemia   ? Left bundle branch block   ? Urinary incontinence   ? ? ?Home Medications Allergies  ?No current facility-administered medications on file prior to encounter.  ? ?Current Outpatient Medications on File Prior to Encounter  ?Medication Sig Dispense Refill  ? albuterol (VENTOLIN HFA) 108 (90 Base) MCG/ACT inhaler Inhale 2 puffs into the lungs every 6 (six) hours as needed for wheezing or shortness of breath. 8.5 g 0  ? estradiol (VIVELLE-DOT) 0.05 MG/24HR patch Place 1 patch onto the skin 2 (two) times a week.    ? FLUoxetine (PROZAC) 20 MG capsule Take 20 mg by mouth daily.    ? loratadine (CLARITIN) 10 MG tablet Take 1 tablet (10 mg total) by mouth daily. (Patient taking differently: Take 10 mg by mouth daily as  needed for allergies.) 90 tablet 3  ? progesterone (PROMETRIUM) 100 MG capsule Take 1 capsule every day by oral route. 90 capsule 0  ? triamcinolone cream (KENALOG) 0.1 % Apply 1 application topically 2 (two) times daily. (Patient taking differently: Apply 1 application. topically 2 (two) times daily as needed (irritation).) 30 g 3  ? Allergies  ?Allergen Reactions  ? Honey Bee Venom Itching, Shortness Of Breath and Swelling  ? Kiwi Extract Swelling  ? Other   ?  Lamb (meat) Per allergy test   ? Peanut-Containing Drug Products   ?  Per allergy test   ? Pork-Derived Products   ?  Against religion to eat  ? Sesame Seed (Diagnostic)   ?  Per allergy test   ? Shrimp (Diagnostic)   ?  Per allergy test   ?  ? ?PSH: ?Past Surgical History:  ?Procedure Laterality Date  ? APPENDECTOMY  1996  ? BREAST BIOPSY Left 08/06/2021  ? stereo biopsy/ coil clip/ path pending  ? COLONOSCOPY WITH PROPOFOL N/A 09/09/2021  ? Procedure: COLONOSCOPY WITH PROPOFOL;  Surgeon: Lin Landsman, MD;  [facility] ENDOSCOPY;  Service: Gastroenterology;  Laterality: N/A;  ? ESOPHAGOGASTRODUODENOSCOPY (EGD) WITH PROPOFOL N/A 09/09/2021  ? Procedure: ESOPHAGOGASTRODUODENOSCOPY (EGD) WITH PROPOFOL;  Surgeon: Lin Landsman, MD;  Location: [facility] ENDOSCOPY;  Service: Gastroenterology;  Laterality: N/A;  ?  ? ?Family History Social History  ?Family History  ?Problem Relation Age of Onset  ? Diabetes Mother   ? Cancer Maternal Aunt   ?  colon cancer  ? Cancer Paternal Aunt   ?     cervical  ? Lung cancer Maternal Grandfather   ? Breast cancer Neg Hx   ?  Social History  ? ?Socioeconomic History  ? Marital status: Married  ?  Spouse name: Not on file  ? Number of children: Not on file  ? Years of education: Not on file  ? Highest education level: Not on file  ?Occupational History  ? Not on file  ?Tobacco Use  ? Smoking status: Never  ?  Passive exposure: Never  ? Smokeless tobacco: Never  ?Vaping Use  ? Vaping Use: Never used  ?Substance and  Sexual Activity  ? Alcohol use: No  ? Drug use: No  ? Sexual activity: Yes  ?  Partners: Male  ?  Birth control/protection: Post-menopausal  ?Other Topics Concern  ? Not on file  ?Social History Narrative  ? Not on file  ? ?Social Determinants of Health  ? ?Financial Resource Strain: Not on file  ?Food Insecurity: Not on file  ?Transportation Needs: Not on file  ?Physical Activity: Not on file  ?Stress: Not on file  ?Social Connections: Not on file  ?  ? ?Review of Systems: ?MSK: As noted per HPI above ?GI: No current Nausea/vomiting ?ENT: Denies sore throat, epistaxis ?CV: Denies chest pain ?Resp: No current shortness of breath ? ?Other than mentioned above, there are no Constitutional, Neurological, Psychiatric, ENT, Ophthalmological, Cardiovascular, Respiratory, GI, GU, Musculoskeletal, Integumentary, Lymphatic, Endocrine or Allergic issues.  ? ?Physical Examination: ?CV: Normal distal pulses ?Lungs: Unlabored respirations ?RLE: Trace knee effusion, with range of motion 0 degrees to 140 degrees without pain.  There is tenderness along the medial joint line.  Positive medial McMurray, and pain with hyperflexion/Steinmann test.  Normal straight leg raise against manual resistance.  Stable Lachman.  Stable anterior drawer and posterior drawer.  Stable to varus and valgus stress in 30 degrees and 0 degrees.  Normal ankle dorsiflexion, plantarflexion, and EHL strength and function.  Sensation intact light touch in the superficial peroneal, deep peroneal, and tibial distributions.  Normal DP and PT pulses.  Warm and well-perfused distally. ? ?Assessment/Plan: ?RIGHT KNEE MEDIAL MENISCUS REPAIR VS PARTIAL MEDIAL MENISCECTOMY  ? ? ?Georgeanna Harrison M.D. ?Orthopaedic Surgery ?[facility] ? ?Review of this patient's medications prescribed by other providers does not in any way constitute an endorsement by this clinician of their use, indications, dosage, route, efficacy, interactions, or other  clinical parameters. ? ?Portions of the record have been created with voice recognition software.  Grammatical and punctuation errors, random word insertions, wrong-word or "sound-a-like" substitutions, pronoun errors (inaccuracies and/or substitutions), and/or incomplete sentences may have occurred due to the inherent limitations of voice recognition software.  Not all errors are caught or corrected.  Although every attempt is made to root out erroneous and incomplete transcription, the note may still not fully represent the intent or opinion of the author.  Read the chart carefully and recognize, using context, where errors/substitutions have occurred.  Any questions or concerns about the content of this note or information contained within the body of this dictation should be addressed directly with the author for clarification.   ?

## 2021-09-10 NOTE — Anesthesia Procedure Notes (Signed)
Procedure Name: LMA Insertion ?Date/Time: 09/10/2021 10:27 AM ?Performed by: Shary Decamp, CRNA ?Pre-anesthesia Checklist: Patient identified, Patient being monitored, Timeout performed, Emergency Drugs available and Suction available ?Patient Re-evaluated:Patient Re-evaluated prior to induction ?Oxygen Delivery Method: Circle system utilized ?Preoxygenation: Pre-oxygenation with 100% oxygen ?Induction Type: IV induction ?Ventilation: Mask ventilation without difficulty ?LMA Size: 4.0 ?Number of attempts: 1 ?Placement Confirmation: positive ETCO2 and breath sounds checked- equal and bilateral ?Tube secured with: Tape ?Dental Injury: Teeth and Oropharynx as per pre-operative assessment  ? ? ? ? ?

## 2021-09-10 NOTE — Discharge Instructions (Signed)
Discharge instructions for Dr. Ornella Coderre, M.D.: Please refer to the two-sided discharge instructions paper that Dr. Derriana Oser placed in the patient's paper chart. Please give this to the patient to take home after reviewing with the patient!!   General discharge instructions:  PLEASE REFER TO TWO-SIDED PAPER INSTRUCTIONS IN PAPER CHART FOR SPECIFIC INSTRUCTIONS!!!  Diet: As you were doing prior to hospitalization. Shower:  Unless otherwise specified (i.e. on two-sided paper instructions with paper chart) may shower but keep the wounds dry, use an occlusive plastic wrap, NO SOAKING IN TUB.  If the bandage gets wet, change with a clean dry gauze. Dressing:  Unless otherwise specified (i.e. on two-sided paper instructions with paper chart), may change your dressing 3-5 days after surgery.  Then change the dressing daily with sterile gauze dressing.  If there are sticky tapes (steri-strips) on your wounds and all the stitches are absorbable.  Leave the steri-strips in place when changing your dressings, they will peel off with time, usually 2-3 weeks. Activity:  Increase activity slowly as tolerated, but follow the restrictions on the two-sided paper discharge instructions sheet that Dr. Karman Biswell placed in the paper chart.  No lifting or driving for 6 weeks. Weight Bearing: WEIGHTBEARING AS TOLERATED (WBAT) on the RIGHT LOWER EXTREMITY. To prevent constipation: You may use over-the-counter stool softener(s) such as Colace (over the counter) 100 mg by mouth twice a day and/or Miralax (over the counter) for constipation as needed.  Drink plenty of fluids (prune juice may be helpful) and high fiber foods.  Itching:  If you experience itching with your medications, try taking only a single pain pill, or even half a pain pill at a time.  You can also use benadryl over the counter for itching or also to help with sleep.  Precautions:  If you experience chest pain or shortness of breath - call 911 immediately for  transfer to the hospital emergency department!!  PLEASE REFER TO TWO-SIDED PAPER INSTRUCTIONS IN PAPER CHART FOR SPECIFIC INSTRUCTIONS!!!  If you develop a fever greater that 101.1 deg F, purulent drainage from wound, increased redness or drainage from wound, or calf pain -- Call the office at [phone].  

## 2021-09-10 NOTE — Progress Notes (Signed)
Orthopedic Tech Progress Note ?Patient Details:  ?Autumn Brown ?1965/06/11 ?[id number] ? ?PACU RN called requesting a pair of CRUTCHES. Patient had a little trouble with crutches she was still feeling the medicine from surgery  ? ?Ortho Devices ?Type of Ortho Device: Crutches ?Ortho Device/Splint Interventions: Ordered, Application, Adjustment ?  ?Post Interventions ?Patient Tolerated: Well, Ambulated well ?Instructions Provided: Care of device, Poper ambulation with device ? ?Janit Pagan ?09/10/2021, 12:58 PM ? ?

## 2021-09-10 NOTE — Anesthesia Postprocedure Evaluation (Signed)
Anesthesia Post Note ? ?Patient: Autumn Brown ? ?Procedure(s) Performed: COLONOSCOPY WITH PROPOFOL ?ESOPHAGOGASTRODUODENOSCOPY (EGD) WITH PROPOFOL ? ?Patient location during evaluation: Endoscopy ?Anesthesia Type: General ?Level of consciousness: awake and alert ?Pain management: pain level controlled ?Vital Signs Assessment: post-procedure vital signs reviewed and stable ?Respiratory status: spontaneous breathing, nonlabored ventilation, respiratory function stable and patient connected to nasal cannula oxygen ?Cardiovascular status: blood pressure returned to baseline and stable ?Postop Assessment: no apparent nausea or vomiting ?Anesthetic complications: no ? ? ?No notable events documented. ? ? ?Last Vitals:  ?Vitals:  ? 09/09/21 1155 09/09/21 1205  ?BP: (!) 94/52 (!) 94/53  ?Pulse:    ?Resp:    ?Temp:    ?SpO2:    ?  ?Last Pain:  ?Vitals:  ? 09/10/21 0737  ?TempSrc:   ?PainSc: 0-No pain  ? ? ?  ?  ?  ?  ?  ?  ? ?Lenard Simmer ? ? ? ? ?

## 2021-09-10 NOTE — Transfer of Care (Signed)
Immediate Anesthesia Transfer of Care Note ? ?Patient: Autumn Brown ? ?Procedure(s) Performed: PARTIAL MEDIAL MENISCECTOMY (Right: Knee) ? ?Patient Location: PACU ? ?Anesthesia Type:GA combined with regional for post-op pain ? ?Level of Consciousness: drowsy, patient cooperative and responds to stimulation ? ?Airway & Oxygen Therapy: Patient Spontanous Breathing and Patient connected to nasal cannula oxygen ? ?Post-op Assessment: Report given to RN, Post -op Vital signs reviewed and stable and Patient moving all extremities X 4 ? ?Post vital signs: Reviewed and stable ? ?Last Vitals:  ?Vitals Value Taken Time  ?BP 102/45 09/10/21 1130  ?Temp    ?Pulse 89 09/10/21 1130  ?Resp 16 09/10/21 1131  ?SpO2 90 % 09/10/21 1130  ?Vitals shown include unvalidated device data. ? ?Last Pain:  ?Vitals:  ? 09/10/21 0837  ?TempSrc:   ?PainSc: 0-No pain  ?   ? ?  ? ?Complications: No notable events documented. ?

## 2021-09-10 NOTE — Anesthesia Postprocedure Evaluation (Signed)
Anesthesia Post Note ? ?Patient: Autumn Brown ? ?Procedure(s) Performed: PARTIAL MEDIAL MENISCECTOMY (Right: Knee) ? ?  ? ?Patient location during evaluation: PACU ?Anesthesia Type: General ?Level of consciousness: awake and alert ?Pain management: pain level controlled ?Vital Signs Assessment: post-procedure vital signs reviewed and stable ?Respiratory status: spontaneous breathing, nonlabored ventilation and respiratory function stable ?Cardiovascular status: blood pressure returned to baseline ?Postop Assessment: no apparent nausea or vomiting ?Anesthetic complications: no ? ? ?No notable events documented. ? ?Last Vitals:  ?Vitals:  ? 09/10/21 1215 09/10/21 1230  ?BP: (!) 96/49 (!) 95/56  ?Pulse: 70 64  ?Resp: 15 18  ?Temp:  36.7 ?C  ?SpO2: 96% 97%  ?  ?Last Pain:  ?Vitals:  ? 09/10/21 1230  ?TempSrc:   ?PainSc: 2   ? ? ?  ?  ?  ?  ?  ?  ? ?Autumn Brown ? ? ? ? ?

## 2021-09-11 ENCOUNTER — Encounter (HOSPITAL_COMMUNITY): Payer: Self-pay | Admitting: Orthopedic Surgery

## 2021-09-11 ENCOUNTER — Ambulatory Visit: Payer: No Typology Code available for payment source | Admitting: Obstetrics and Gynecology

## 2021-09-11 LAB — SURGICAL PATHOLOGY

## 2021-09-12 ENCOUNTER — Encounter: Payer: Self-pay | Admitting: Gastroenterology

## 2021-09-12 ENCOUNTER — Other Ambulatory Visit: Payer: Self-pay | Admitting: *Deleted

## 2021-09-12 DIAGNOSIS — R9389 Abnormal findings on diagnostic imaging of other specified body structures: Secondary | ICD-10-CM

## 2021-09-16 ENCOUNTER — Ambulatory Visit (INDEPENDENT_AMBULATORY_CARE_PROVIDER_SITE_OTHER): Payer: No Typology Code available for payment source | Admitting: Obstetrics and Gynecology

## 2021-09-16 ENCOUNTER — Encounter: Payer: Self-pay | Admitting: Obstetrics and Gynecology

## 2021-09-16 ENCOUNTER — Other Ambulatory Visit (HOSPITAL_COMMUNITY)
Admission: RE | Admit: 2021-09-16 | Discharge: 2021-09-16 | Disposition: A | Discharge status: Home / Self Care | Payer: No Typology Code available for payment source | Source: Ambulatory Visit | Attending: Obstetrics and Gynecology | Admitting: Obstetrics and Gynecology

## 2021-09-16 VITALS — BP 100/64 | HR 76 | Ht 66.5 in

## 2021-09-16 DIAGNOSIS — R9389 Abnormal findings on diagnostic imaging of other specified body structures: Secondary | ICD-10-CM | POA: Insufficient documentation

## 2021-09-16 DIAGNOSIS — Z124 Encounter for screening for malignant neoplasm of cervix: Secondary | ICD-10-CM | POA: Diagnosis not present

## 2021-09-16 DIAGNOSIS — Z9229 Personal history of other drug therapy: Secondary | ICD-10-CM

## 2021-09-16 NOTE — Patient Instructions (Signed)

## 2021-09-16 NOTE — Progress Notes (Unsigned)
GYNECOLOGY  VISIT   HPI: 56 y.o.   Married  Caucasian/Turkish  female   [phone] with Patient's last menstrual period was 08/18/2019 (exact date).   here for endometrial biopsy for thickened endometrium on pelvic ultrasound.   Patient requests cervical cancer screening today.   Of note, patient had knee surgery 09/10/21.  She started on HRT and she wanted to have a baseline measurement evaluation of her pelvic organs.  She found her endometrium to be thickened when she scanned herself.  She has not had postmenopausal bleeding.  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   She did stop her HRT after I saw her on 09/06/21.   She had a formal pelvic ultrasound on 09/06/21.  Results are noted below.   Narrative & Impression  CLINICAL DATA:  Initial evaluation for endometrial thickening, on HRT.   EXAM: TRANSABDOMINAL AND TRANSVAGINAL ULTRASOUND OF PELVIS   TECHNIQUE: Both transabdominal and transvaginal ultrasound examinations of the pelvis were performed. Transabdominal technique was performed for global imaging of the pelvis including uterus, ovaries, adnexal regions, and pelvic cul-de-sac. It was necessary to proceed with endovaginal exam following the transabdominal exam to visualize the endometrium and ovaries.   COMPARISON:  Prior ultrasound from 09/15/2019.   FINDINGS: Uterus   Measurements: 9.2 x 4.3 x 6.4 cm = volume: 132.6 mL. Uterus is anteverted. Heterogeneous echotexture seen within the uterine myometrium without discrete fibroid.   Endometrium   Thickness: 13.8 mm. Endometrial complex demonstrates a heterogeneous echotexture without focal lesion.   Right ovary   Measurements: 2.3 x 1.5 x 1.7 cm = volume: 3.1 mL. Normal appearance/no adnexal mass.   Left ovary   Measurements: 2.0 x 1.2 x 0.9 cm = volume: 1.2 mL. Normal appearance/no adnexal mass.   Other findings   No abnormal free fluid.    IMPRESSION: 1. Endometrial stripe heterogeneous and thickened up to 13.8 mm, considered abnormal for an asymptomatic post-menopausal female. Endometrial sampling should be considered to exclude carcinoma. 2. Otherwise unremarkable pelvic ultrasound.     Electronically Signed   By: Jeannine Boga M.D.   On: 09/07/2021 05:13      GYNECOLOGIC HISTORY: Patient's last menstrual period was 08/18/2019 (exact date). Contraception:  PMP Menopausal hormone therapy:  Vivelle dot, Progesterone - discontinued on 09/06/21. Last mammogram:  05-21-21 Possible asymmetry left breast 06-07-21 Unilat.Left MMG Left breast mass Cat.C BiRADS 4--08-06-21 Breast Biopsy Benign Last pap smear:  02-04-18 Neg:Neg HR HPV        OB History     Gravida  4   Para  3   Term  3   Preterm  0   AB  1   Living  3      SAB  1   IAB      Ectopic      Multiple      Live Births  3              Patient Active Problem List   Diagnosis Date Noted   Bloating    Generalized abdominal pain    Adhesive capsulitis of left shoulder 11/02/2017   Irritable bowel syndrome 11/02/2017   History of BCG vaccination 10/23/2017   Depression 07/01/2017   Asthma 07/01/2017   Left thyroid nodule 01/30/2017   Prediabetes 08/03/2016    Past Medical History:  Diagnosis Date   Abnormal uterine bleeding    Elevated LDL cholesterol level 2020   Hypercholesterolemia  Left bundle branch block    Urinary incontinence     Past Surgical History:  Procedure Laterality Date   APPENDECTOMY  1996   BREAST BIOPSY Left 08/06/2021   stereo biopsy/ coil clip/ path pending   COLONOSCOPY WITH PROPOFOL N/A 09/09/2021   Procedure: COLONOSCOPY WITH PROPOFOL;  Surgeon: Lin Landsman, MD;  Location: [facility] ENDOSCOPY;  Service: Gastroenterology;  Laterality: N/A;   ESOPHAGOGASTRODUODENOSCOPY (EGD) WITH PROPOFOL N/A 09/09/2021   Procedure: ESOPHAGOGASTRODUODENOSCOPY (EGD) WITH PROPOFOL;  Surgeon: Lin Landsman,  MD;  Location: [facility] ENDOSCOPY;  Service: Gastroenterology;  Laterality: N/A;   KNEE ARTHROSCOPY Right 09/10/2021   Procedure: PARTIAL MEDIAL MENISCECTOMY;  Surgeon: Georgeanna Harrison, MD;  Location: [place];  Service: Orthopedics;  Laterality: Right;    Current Outpatient Medications  Medication Sig Dispense Refill   albuterol (VENTOLIN HFA) 108 (90 Base) MCG/ACT inhaler Inhale 2 puffs into the lungs every 6 (six) hours as needed for wheezing or shortness of breath. 8.5 g 0   COVID-19 At Home Antigen Test (CARESTART COVID-19 HOME TEST) KIT Use as directed 2 kit 0   estradiol (VIVELLE-DOT) 0.05 MG/24HR patch Place 1 patch onto the skin 2 (two) times a week.     FLUoxetine (PROZAC) 20 MG capsule Take 20 mg by mouth daily.     loratadine (CLARITIN) 10 MG tablet Take 1 tablet (10 mg total) by mouth daily. (Patient taking differently: Take 10 mg by mouth daily as needed for allergies.) 90 tablet 3   oxyCODONE (ROXICODONE) 5 MG immediate release tablet Take 1 tablet by mouth every 6 hours as needed for severe pain or breakthrough pain (POSTOPERATIVE PAIN). 30 tablet 0   progesterone (PROMETRIUM) 100 MG capsule Take 1 capsule every day by oral route. 90 capsule 0   promethazine (PHENERGAN) 12.5 MG tablet Take 1 tablet by mouth every 6 hours as needed for nausea or vomiting. 30 tablet 0   triamcinolone cream (KENALOG) 0.1 % Apply 1 application topically 2 (two) times daily. (Patient taking differently: Apply 1 application. topically 2 (two) times daily as needed (irritation).) 30 g 3   No current facility-administered medications for this visit.     ALLERGIES: Honey bee venom, Kiwi extract, Other, Peanut-containing drug products, Pork-derived products, Sesame seed (diagnostic), and Shrimp (diagnostic)  Family History  Problem Relation Age of Onset   Diabetes Mother    Cancer Maternal Aunt        colon cancer   Cancer Paternal Aunt        cervical   Lung cancer Maternal Grandfather    Breast cancer  Neg Hx     Social History   Socioeconomic History   Marital status: Married    Spouse name: Not on file   Number of children: Not on file   Years of education: Not on file   Highest education level: Not on file  Occupational History   Not on file  Tobacco Use   Smoking status: Never    Passive exposure: Never   Smokeless tobacco: Never  Vaping Use   Vaping Use: Never used  Substance and Sexual Activity   Alcohol use: No   Drug use: No   Sexual activity: Yes    Partners: Male    Birth control/protection: Post-menopausal  Other Topics Concern   Not on file  Social History Narrative   Not on file   Social Determinants of Health   Financial Resource Strain: Not on file  Food Insecurity: Not on file  Transportation Needs: Not  on file  Physical Activity: Not on file  Stress: Not on file  Social Connections: Not on file  Intimate Partner Violence: Not on file    Review of Systems  All other systems reviewed and are negative.  PHYSICAL EXAMINATION:    BP 100/64   Pulse 76   Ht 5' 6.5" (1.689 m)   LMP 08/18/2019 (Exact Date)   SpO2 97%   BMI 22.74 kg/m     General appearance: alert, cooperative and appears stated age   Pap and EMB Consent for procedure.  Pap and HR HPV collected.  Sterile prep of cervical with 10 cc 1% lidocaine, lot [id number], exp 11/2023.  Tenaculum to anterior cervical lip.  Pipelle passed to 8 cm with Pipelle x 1 and then rocket endometrial biopsy instrument x 2. Tissue to pathology.  No complications.  Minimal EBL.  Chaperone was present for exam:  Estill Bamberg, CMA.  ASSESSMENT  Thickened endometrium.  History of HRT use.  Cervical cancer screening.   PLAN  Pelvic ultrasound images and report reviewed.  Etiologies of thickened endometrium include potential polyp, hyperplasia, malignancy. Pap and HR HPV collected.  Follow up EMB results.  Final plan to follow.    An After Visit Summary was printed and given to the patient.  20 min   total time was spent for this patient encounter, including preparation, face-to-face counseling with the patient, coordination of care, and documentation of the encounter in addition to performing the endometrial biopsy.

## 2021-09-17 ENCOUNTER — Other Ambulatory Visit (HOSPITAL_COMMUNITY)
Admission: RE | Admit: 2021-09-17 | Discharge: 2021-09-17 | Disposition: A | Discharge status: Home / Self Care | Payer: No Typology Code available for payment source | Source: Ambulatory Visit | Attending: Obstetrics and Gynecology | Admitting: Obstetrics and Gynecology

## 2021-09-17 DIAGNOSIS — Z124 Encounter for screening for malignant neoplasm of cervix: Secondary | ICD-10-CM | POA: Insufficient documentation

## 2021-09-18 LAB — CYTOLOGY - PAP
Comment: NEGATIVE
Diagnosis: NEGATIVE
High risk HPV: NEGATIVE

## 2021-09-18 LAB — SURGICAL PATHOLOGY

## 2021-09-24 ENCOUNTER — Telehealth: Payer: Self-pay

## 2021-09-24 NOTE — Telephone Encounter (Signed)
I spoke with patient and relayed result note/recommendation. 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 She believes the HRT is the issue and that she needs more progesterone.

## 2021-09-24 NOTE — Telephone Encounter (Signed)
-----   Message from Patton Salles, MD sent at 09/22/2021  8:13 AM EDT ----- Please contact patient with results.  Her pap is normal and her high risk HPV test is negative.  Her endometrial biopsy showed inactive and weakly proliferative endometrium.  No polyp, hyperplasia, or cancer were seen.   Her endometrium was thickened on her pelvic ultrasound.  I do recommend a sonohysterogram to be sure that a polyp was not missed.  Please schedule this.  It can be done at our office or at [facility].

## 2021-09-25 ENCOUNTER — Telehealth: Payer: Self-pay

## 2021-09-25 DIAGNOSIS — R9389 Abnormal findings on diagnostic imaging of other specified body structures: Secondary | ICD-10-CM

## 2021-09-25 NOTE — Telephone Encounter (Signed)
Pt calling to report that she is planning on moving out of the [place] in 3 weeks so would like some sort of plan as far as her care asap. She would like to know if you think she should go with IUD, if so, she would like to get scheduled for insertion. If not, she is requesting refills for her HRTs. Estradiol patch/progesterone--states she only has about 1 week left. Please advise.   FYI. Last AEX 09/28/20 Mammo UTD.

## 2021-09-26 ENCOUNTER — Other Ambulatory Visit: Payer: Self-pay

## 2021-09-26 DIAGNOSIS — Z30430 Encounter for insertion of intrauterine contraceptive device: Secondary | ICD-10-CM

## 2021-09-26 NOTE — Telephone Encounter (Signed)
FYI. Pt would like to try to have sono done and IUD placed before move..Wants to try here and [facility] to see where we can get done sooner.

## 2021-09-26 NOTE — Telephone Encounter (Signed)
Please see phone note that I just sent through today.

## 2021-09-26 NOTE — Telephone Encounter (Signed)
Encounter reviewed and closed.  

## 2021-09-26 NOTE — Telephone Encounter (Signed)
FYI. Pt is scheduled for sonohysterogram @ [facility] on 10/03/21 @ 10:30 with arrival time of 10:10.   Pt then scheduled to see BS to discuss results and if WNL, IUD insertion on 10/09/21 @ 11.   Pt has been made aware and voices understanding.

## 2021-09-26 NOTE — Telephone Encounter (Signed)
I have reviewed the patient's chart again.   An endometrial lining of 13.8 mm is quick thick.  It is still possible that there is an endometrial polyp present.   Placement of a Mirena IUD may be a possibility immediately following sonohysterogram, if the sonohysterogram is normal and if the schedule permits this before she moves out of state.   Her other option is to contact her provider who gave her the hormone therapy and request refills there. It was not prescribed through this office.

## 2021-10-03 ENCOUNTER — Other Ambulatory Visit: Payer: No Typology Code available for payment source

## 2021-10-03 NOTE — Telephone Encounter (Signed)
Dr.Silva please see below from [place]:  Ladies  This patient came into the office today because she would like to let Dr Quincy Simmonds know that she does not feel comfortable to have the [facility] that she has scheduled at [facility] because the tech is a "female" and not a "female". She doesn't feel comfortable doing this procedure with a female provider"  She would like to know if the [facility] is absolutely necessary. If not she wants to come in just to get her iud inserted.  I called and re-read the below to patient, she will be moving to [place] at the end of this month 28th or 29th. She is scheduled for possible IUD insertion on 10/08/21. Patient wanted to know how to proceed with appointment scheduled on 10/08/21.  Please advise

## 2021-10-04 ENCOUNTER — Encounter: Payer: Self-pay | Admitting: Internal Medicine

## 2021-10-04 ENCOUNTER — Other Ambulatory Visit: Payer: Self-pay

## 2021-10-04 ENCOUNTER — Ambulatory Visit: Payer: No Typology Code available for payment source | Admitting: Internal Medicine

## 2021-10-04 VITALS — BP 110/60 | HR 74 | Temp 97.9°F | Resp 16 | Ht 65.0 in | Wt 135.2 lb

## 2021-10-04 DIAGNOSIS — T781XXA Other adverse food reactions, not elsewhere classified, initial encounter: Secondary | ICD-10-CM

## 2021-10-04 DIAGNOSIS — J3089 Other allergic rhinitis: Secondary | ICD-10-CM | POA: Diagnosis not present

## 2021-10-04 DIAGNOSIS — J302 Other seasonal allergic rhinitis: Secondary | ICD-10-CM

## 2021-10-04 DIAGNOSIS — H1013 Acute atopic conjunctivitis, bilateral: Secondary | ICD-10-CM | POA: Diagnosis not present

## 2021-10-04 DIAGNOSIS — K5282 Eosinophilic colitis: Secondary | ICD-10-CM | POA: Diagnosis not present

## 2021-10-04 DIAGNOSIS — J4520 Mild intermittent asthma, uncomplicated: Secondary | ICD-10-CM | POA: Diagnosis not present

## 2021-10-04 DIAGNOSIS — H1010 Acute atopic conjunctivitis, unspecified eye: Secondary | ICD-10-CM

## 2021-10-04 DIAGNOSIS — T782XXA Anaphylactic shock, unspecified, initial encounter: Secondary | ICD-10-CM

## 2021-10-04 DIAGNOSIS — L503 Dermatographic urticaria: Secondary | ICD-10-CM

## 2021-10-04 MED ORDER — FEXOFENADINE HCL 180 MG PO TABS
180.0000 mg | ORAL_TABLET | Freq: Every day | ORAL | 5 refills | Status: AC
Start: 2021-10-04 — End: ?
  Filled 2021-10-04 – 2022-03-09 (×3): qty 30, 30d supply, fill #0
  Filled 2022-04-21: qty 30, 30d supply, fill #1

## 2021-10-04 MED ORDER — EPINEPHRINE 0.3 MG/0.3ML IJ SOAJ
0.3000 mg | INTRAMUSCULAR | 2 refills | Status: AC | PRN
Start: 2021-10-04 — End: ?
  Filled 2021-10-04: qty 2, 30d supply, fill #0

## 2021-10-04 NOTE — Progress Notes (Signed)
New Patient Note  RE: Autumn Brown MRN: [id number] DOB: Mar 27, 1966 Date of Office Visit: 10/04/2021  Consult requested by: Lin Landsman, MD Primary care provider: Juline Patch, MD  Chief Complaint: Other (Food intolerance. Patient presented sheet with information. Needs education on what past panels mean and what to do with information.), Allergy Testing (Environmental), and Asthma  History of Present Illness: I had the pleasure of seeing Autumn Brown for initial evaluation at the [facility] on 10/04/2021. She is a 56 y.o. female, who is referred here by Juline Patch, MD for the evaluation of food intolerance.  History obtained from patient.  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  However biopsies returned negative for eosinophils in the esophagus, stomach and duodenum.  She did have focal eosinophilic infiltrates in the colon however.  GIs concern for food intolerance is the underlying disease.  She has not started any elimination diets.  She has not kept a food diary.  Eats egg, dairy, wheat, soy, fish, shellfish, peanuts, tree nuts, sesame without immediate reactions Carries an epinephrine autoinjector: no Has food allergy action plan no  Asthma  She was prescribed albuterol HFA when she started developing dyspnea with rhinitis flares.  Reports using about once a week.  Denies any nocturnal symptoms.  Denies any hospitalizations.  Denies any recent corticosteroid use.  No prior spirometry for review  Chronic rhinitis: started as a  young adult  Symptoms include: nasal congestion, rhinorrhea, post nasal drainage, sneezing, watery eyes, itchy eyes, and itchy nose  Occurs seasonally-spring and fall  Potential triggers: denies  Treatments tried: zyrtec and loratadine  Previous allergy testing: yes History of reflux/heartburn: yes History of chronic sinusitis or sinus surgery: no Nonallergic triggers: Denies    Also reports a history of dermatographia some.  Denies any spontaneous urticaria.  Also reports a history of throat tightness and facial angioedema with bee sting as a child.  She has not been stung since.  She does not have an EpiPen and has not been evaluated for hymenoptera allergy.  She is relocating to [place] at the end of the month.    Assessment and Plan: Autumn Brown is a 56 y.o. 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 - Avoid these foods until you establish care with an Allergist in [place].  - discussed that if patient notes improvement in GI symptoms, can consider slowly reintroducing each food one by one for at least two weeks to determine if there is any resurgence of symptoms, and if so, to continue avoiding that particular food -  consider nutrition consult to help with diet - consider addition of Dupilumab therapy if  no response  - Epipen prescribed given risk of developing IgE mediated food allergy with avoidance of sensitized (testing positive foods)   Allergic Rhinitis: moderately well controlled  - Testing today showed positive grass, weeds, trees, molds, cat, dog, mixed feathers, horse, roach, tobacco leaf - Copy of test results provided.  - Avoidance measures provided. - Start taking:  Allegra 124m twice a day ( this will help with dermatographism as well)  - You can use an extra dose of the antihistamine, if needed, for breakthrough symptoms.  - Consider nasal saline rinses 1-2 times daily to remove allergens from the nasal cavities as well as help with mucous clearance (this is especially helpful to do before the nasal sprays are given) - Consider allergy shots as a means of long-term control and can reduce lifetime use of medications  - Allergy shots "re-train" and "reset" the immune system to ignore environmental allergens and decrease the resulting immune response to those allergens (sneezing, itchy watery eyes, runny nose, nasal congestion, etc).    - Allergy shots improve symptoms in 75-85%  - Allergy shots are the only potential permanent and disease modifying option  - We can discuss more at the next appointment if the medications are not working for you.  Mild intermittent asthma: Well-controlled - Breathing test today showed: Looked great  PLAN: - Prior to physical activity: albuterol 2 puffs 10-15 minutes before physical activity. - Rescue medications: albuterol 4 puffs every 4-6 hours as needed - Get Influenza Vaccine and appropriate Pneumonia and COVID 19 boosters  - Asthma control goals:  * Full participation in all desired activities (may need albuterol before activity) * Albuterol use two time or less a week on average (not counting use with activity) * Cough interfering with sleep two time or less a month * Oral steroids no more than once a year * No hospitalizations   Bee  Allergy  -We will get blood work to update allergy status  - for SKIN only reaction, okay to take Benadryl 1 capsules every 6 hours - for SKIN + ANY additional symptoms, OR IF concern for LIFE THREATENING reaction = Epipen Autoinjector EpiPen 0.3 mg. - If using Epinephrine autoinjector, call 911 - Medic Alert identification is recommended.   Dermatographism This is due to the spontaneous release of histamine in the skin when you scratch or exposed to friction Start Allegra 180 mg twice a day for prevention of symptoms (this will not make you sleepy and can be bought OTC)   [place] you to establish care with an allergist once you arrive.  It will likely take many months to fully narrow food triggers for EGID as well as management of your other allergic disease  Thank you so much for letting me partake in your care today.  Don't hesitate to reach out if you have any additional concerns!  ERoney Marion MD  [facility] [place]  No follow-ups on file.  Meds ordered this encounter  Medications   EPINEPHrine 0.3 mg/0.3 mL IJ SOAJ injection    Sig: Inject 0.3 mg into the muscle as needed for anaphylaxis.    Dispense:  1 each    Refill:  2   fexofenadine (ALLEGRA ALLERGY) 180 MG tablet    Sig: Take 1 tablet (180 mg total) by mouth daily.    Dispense:  30  tablet    Refill:  5   Lab Orders         Allergen Hymenoptera Panel      Other allergy screening: Asthma: yes Rhino conjunctivitis: yes Food allergy:  EGID Medication allergy: no Hymenoptera allergy: yes Urticaria:  dermatographism  Eczema:no History of recurrent infections suggestive of immunodeficency: no  Diagnostics: Spirometry:  Tracings reviewed. Her effort: Good reproducible efforts. FVC: 3.27 L FEV1: 2.73 L, 110% predicted FEV1/FVC ratio: 83% Interpretation: Spirometry consistent with normal pattern.  Please see scanned spirometry results  for details.  Skin Testing: Environmental allergy panel and select foods. grass, weeds, trees, molds, cat, dog, mixed feathers, horse, roach, tobacco leaf cows milk, egg, cashew, almond, hazelnut, [place] nut, pistachio, shrimp Results interpreted by myself and discussed with patient/family.  Airborne Adult Perc - 10/04/21 0954     Time Antigen Placed 9747    Allergen Manufacturer Lavella Hammock    Location Back    Number of Test 59    1. Control-Buffer 50% Glycerol Negative    2. Control-Histamine 1 mg/ml 3+    3. Albumin saline Negative    4. Avery 2+    5. [place] 2+    6. Johnson Negative    7. Kentucky Blue 2+    8. Meadow Fescue 2+    9. Perennial Rye Negative    10. Sweet Vernal Negative    11. Timothy 3+    12. Cocklebur 3+    13. Burweed Marshelder 3+    14. Ragweed, short 3+    15. Ragweed, Giant 2+    16. Plantain,  English Negative    17. Lamb's Quarters Negative    18. Sheep Sorrell Negative    19. Rough Pigweed Negative    20. Marsh Elder, Rough Negative    21. Mugwort, Common Negative    22. Ash mix Negative    23. Birch mix 3+    24. Beech American 3+    25. Box, Elder 3+    26. Cedar, red Negative    27. Cottonwood, [place] Negative    28. Elm mix Negative    29. Hickory Negative    30. Maple mix Negative    31. Oak, [place] mix Negative    32. Pecan Pollen Negative    33. Pine mix Negative    34. Sycamore Eastern Negative    35. Melcher-D[place], Black Pollen 3+    36. Alternaria alternata Negative    37. Cladosporium Herbarum Negative    38. Aspergillus mix 3+    39. Penicillium mix 3+    40. Bipolaris sorokiniana (Helminthosporium) Negative    41. Drechslera spicifera (Curvularia) Negative    42. Mucor plumbeus Negative    43. Fusarium moniliforme Negative    44. Aureobasidium pullulans (pullulara) Negative    45. Rhizopus oryzae 4+    46. Botrytis cinera Negative    47. Epicoccum nigrum 3+    48. Phoma betae 3+    49. Candida Albicans 2+    50.  Trichophyton mentagrophytes 3+    51. Mite, D Farinae  5,000 AU/ml Negative    52. Mite, D Pteronyssinus  5,000 AU/ml Negative    53. Cat Hair 10,000 BAU/ml Negative    54.  Dog Epithelia Negative    55. Mixed Feathers 3+    56. Horse Epithelia 4+    57. Cockroach, German 4+    58. Mouse Negative    59. Tobacco Leaf 3+  Intradermal - 10/04/21 1000     Time Antigen Placed 1039    Allergen Manufacturer Lavella Hammock    Location Arm    Number of Test 8    Control Negative    Johnson 4+    Weed mix 4+    Mold 1 Negative    Mold 3 3+    Mold 4 3+    Cat 4+    Dog 4+    Mite mix 4+             Food Adult Perc - 10/04/21 0900     Time Antigen Placed 2500    Allergen Manufacturer Lavella Hammock    Location Back    Number of allergen test 20    1. Peanut Negative    2. Soybean Negative    3. Wheat Negative    4. Sesame Negative    5. Milk, cow 3+    6. Egg White, Chicken 3+    7. Casein Negative    10. Cashew 3+    11. Pecan Food Negative    12. [place] Negative    13. Almond 3+    14. Hazelnut 3+    15. [place] nut 3+    16. Coconut 3+    17. Pistachio 3+    25. Shrimp 3+    26. Crab Negative    27. Lobster Negative    28. Oyster Negative    29. Scallops Negative             Past Medical History: Patient Active Problem List   Diagnosis Date Noted   Bloating    Generalized abdominal pain    Adhesive capsulitis of left shoulder 11/02/2017   Irritable bowel syndrome 11/02/2017   History of BCG vaccination 10/23/2017   Depression 07/01/2017   Asthma 07/01/2017   Left thyroid nodule 01/30/2017   Prediabetes 08/03/2016   Past Medical History:  Diagnosis Date   Abnormal uterine bleeding    Asthma    Eczema    Elevated LDL cholesterol level 2020   Hypercholesterolemia    Left bundle branch block    Urinary incontinence    Past Surgical History: Past Surgical History:  Procedure Laterality Date   APPENDECTOMY  1996   BREAST BIOPSY Left  08/06/2021   stereo biopsy/ coil clip/ path pending   COLONOSCOPY WITH PROPOFOL N/A 09/09/2021   Procedure: COLONOSCOPY WITH PROPOFOL;  Surgeon: Lin Landsman, MD;  Location: [facility];  Service: Gastroenterology;  Laterality: N/A;   ESOPHAGOGASTRODUODENOSCOPY (EGD) WITH PROPOFOL N/A 09/09/2021   Procedure: ESOPHAGOGASTRODUODENOSCOPY (EGD) WITH PROPOFOL;  Surgeon: Lin Landsman, MD;  [facility];  Service: Gastroenterology;  Laterality: N/A;   KNEE ARTHROSCOPY Right 09/10/2021   Procedure: PARTIAL MEDIAL MENISCECTOMY;  Surgeon: Georgeanna Harrison, MD;  Location: [facility];  Service: Orthopedics;  Laterality: Right;   Medication List:  Current Outpatient Medications  Medication Sig Dispense Refill   albuterol (VENTOLIN HFA) 108 (90 Base) MCG/ACT inhaler Inhale 2 puffs into the lungs every 6 (six) hours as needed for wheezing or shortness of breath. 8.5 g 0   BIOTIN PO Take by mouth daily.     CALCIUM PO Take by mouth daily as needed (per patient report).     Cholecalciferol (VITAMIN D3 PO) Take 10,000 Units by mouth daily as needed (per patient report).     COLLAGEN PO Take by mouth daily.     EPINEPHrine 0.3 mg/0.3 mL IJ SOAJ injection Inject 0.3 mg into the  muscle as needed for anaphylaxis. 1 each 2   estradiol (VIVELLE-DOT) 0.05 MG/24HR patch Place 1 patch onto the skin 2 (two) times a week.     fexofenadine (ALLEGRA ALLERGY) 180 MG tablet Take 1 tablet (180 mg total) by mouth daily. 30 tablet 5   FLUoxetine (PROZAC) 20 MG capsule Take 20 mg by mouth daily.     loratadine (CLARITIN) 10 MG tablet Take 1 tablet (10 mg total) by mouth daily. (Patient taking differently: Take 10 mg by mouth daily as needed for allergies.) 90 tablet 3   MAGNESIUM PO Take by mouth daily.     progesterone (PROMETRIUM) 100 MG capsule Take 1 capsule every day by oral route. 90 capsule 0   triamcinolone cream (KENALOG) 0.1 % Apply 1 application topically 2 (two) times daily. (Patient taking  differently: Apply 1 application  topically 2 (two) times daily as needed (irritation).) 30 g 3   COVID-19 At Home Antigen Test (CARESTART COVID-19 HOME TEST) KIT Use as directed (Patient not taking: Reported on 10/04/2021) 2 kit 0   No current facility-administered medications for this visit.   Allergies: Allergies  Allergen Reactions   Honey Bee Venom Itching, Shortness Of Breath and Swelling   Kiwi Extract Swelling   Other     Lamb (meat) Per allergy test    Peanut-Containing Drug Products     Per allergy test    Pork-Derived Products     Against religion to eat   Sesame Seed (Diagnostic)     Per allergy test    Shrimp (Diagnostic)     Per allergy test    Social History: Social History   Socioeconomic History   Marital status: Married    Spouse name: Not on file   Number of children: Not on file   Years of education: Not on file   Highest education level: Not on file  Occupational History   Not on file  Tobacco Use   Smoking status: Never    Passive exposure: Never   Smokeless tobacco: Never  Vaping Use   Vaping Use: Never used  Substance and Sexual Activity   Alcohol use: No   Drug use: No   Sexual activity: Yes    Partners: Male    Birth control/protection: Post-menopausal  Other Topics Concern   Not on file  Social History Narrative   Not on file   Social Determinants of Health   Financial Resource Strain: Not on file  Food Insecurity: Not on file  Transportation Needs: Not on file  Physical Activity: Not on file  Stress: Not on file  Social Connections: Not on file   Lives in a townhome that is 57 years old.  There are roaches in the house and bed is 2 feet off the floor.  There are no dust mite precautions.  She does have a HEPA filter and does not live near an interstate or industrial area.  She is exposed to hydrogen peroxide at her work.. Smoking: No exposure Occupation: Ultrasound Animal nutritionist History: Water Damage/mildew in the  house: yes Carpet in the family room: no Carpet in the bedroom: yes Heating: gas Cooling: central Pet: no  Family History: Family History  Problem Relation Age of Onset   Diabetes Mother    Eczema Brother    Cancer Maternal Aunt        colon cancer   Cancer Paternal Aunt        cervical   Lung cancer Maternal Grandfather  Breast cancer Neg Hx      ROS: All others negative except as noted per HPI.   Objective: BP 110/60   Pulse 74   Temp 97.9 F (36.6 C) (Temporal)   Resp 16   Ht '5\' 5"'  (1.651 m)   Wt 135 lb 3.2 oz (61.3 kg)   LMP 08/18/2019 (Exact Date)   SpO2 97%   BMI 22.50 kg/m  Body mass index is 22.5 kg/m.  General Appearance:  Alert, cooperative, no distress, appears stated age  Head:  Normocephalic, without obvious abnormality, atraumatic  Eyes:  Conjunctiva clear, EOM's intact  Nose: Nares normal,  erythematous nasal mucosa, no visible anterior polyps, and septum midline  Throat: Lips, tongue normal; teeth and gums normal, normal posterior oropharynx and no tonsillar exudate  Neck: Supple, symmetrical  Lungs:   clear to auscultation bilaterally, Respirations unlabored, no coughing  Heart:  regular rate and rhythm and no murmur, Appears well perfused  Extremities: No edema  Skin: Skin color, texture, turgor normal, no rashes or lesions on visualized portions of skin, + dermatographism   Neurologic: No gross deficits   The plan was reviewed with the patient/family, and all questions/concerned were addressed.  It was my pleasure to see Autumn Brown today and participate in her care. Please feel free to contact me with any questions or concerns.  Sincerely,  Roney Marion, MD Allergy & Immunology  [facility] office: [phone] [facility] office: [phone]

## 2021-10-04 NOTE — Telephone Encounter (Signed)
Sent message to Lupita Leash to look at schedule for [facility] here.

## 2021-10-04 NOTE — Patient Instructions (Addendum)
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 - Avoid these foods until you establish care with an Allergist in [place].  - discussed that if patient notes improvement in GI symptoms, can consider slowly reintroducing each food one by one for at least two weeks to determine if there is any resurgence of symptoms, and if so, to continue avoiding that particular food - consider nutrition consult to help with diet - consider addition of Dupilumab therapy if no response  - Epipen prescribed given risk of developing IgE mediated food allergy with avoidance of sensitized (testing positive foods)   Allergic Rhinitis: moderately well controlled  - Testing today showed positive grass, weeds, trees, molds, cat, dog, mixed feathers, horse, roach, tobacco leaf - Copy of test results provided.  - Avoidance measures provided. - Start taking:  Allegra 180mg  twice a day ( this will help with dermatographism as well)  - You can use an extra dose of the antihistamine, if needed, for breakthrough symptoms.  - Consider nasal saline rinses 1-2 times daily to remove allergens from the nasal cavities as well as help with mucous clearance (this is especially helpful to do before the nasal sprays are given) - Consider allergy shots as a means of long-term control and can reduce lifetime use of medications  - Allergy shots "re-train" and "reset" the immune system to ignore environmental allergens and decrease the resulting immune response to those allergens (sneezing, itchy watery eyes, runny nose, nasal congestion, etc).    - Allergy shots improve symptoms in 75-85%  - Allergy shots are the only potential permanent and disease modifying option  - We can  discuss more at the next appointment if the medications are not working for you.  Mild intermittent asthma: Well-controlled - Breathing test today showed: Looked great  PLAN: - Prior to physical activity: albuterol 2 puffs 10-15 minutes before physical activity. - Rescue medications: albuterol 4 puffs every 4-6 hours as needed - Get Influenza Vaccine and appropriate Pneumonia and COVID 19 boosters  - Asthma control goals:  * Full participation in all desired activities (may need albuterol before activity) * Albuterol use two time or less a week on average (not counting use with activity) * Cough interfering with sleep two time or less a month * Oral steroids no more than once a year * No hospitalizations   Bee Allergy  -We will get blood work to update allergy status  - for SKIN only reaction, okay to take Benadryl 1 capsules every 6 hours - for SKIN + ANY additional symptoms, OR IF concern for LIFE THREATENING reaction = Epipen Autoinjector EpiPen 0.3 mg. - If using Epinephrine autoinjector, call 911 - Medic Alert identification is recommended.   Dermatographism This is due to the spontaneous release of histamine in the skin when you scratch or exposed to friction Start Allegra 180 mg twice a day for prevention of symptoms (this will not make you sleepy and can be bought OTC)   Given you are relocating to [place] is very important for you to establish care with an allergist once you arrive.  It will likely take many months to fully narrow food triggers for EGID as well as management of your other allergic disease  Thank you so much for letting me partake in your  care today.  Don't hesitate to reach out if you have any additional concerns!  Roney Marion, MD  [facility] [place]

## 2021-10-04 NOTE — Telephone Encounter (Addendum)
Please try to arrange for sonohysterogram in our office.  If it shows no intrauterine mass, I can place the Mirena IUD at that time.   This may mean that her current appointment date/time would need to change.

## 2021-10-07 NOTE — Telephone Encounter (Signed)
Patient informed with below, 10/09/21 IUD insert canceled.

## 2021-10-07 NOTE — Telephone Encounter (Signed)
Dr.Silva I check with Debarah Crape and the next available appointment for sonohysterogram is July. Patient will be leaving on 10/23/21 for [place]. Should we cancel IUD insertion for tomorrow?  Follow up  sonohysterogram once she establishes care in [place] since not appointments here and patient does not want procedure done at [facility] due to female tech? Please advise

## 2021-10-07 NOTE — Progress Notes (Deleted)
GYNECOLOGY  VISIT   HPI: 56 y.o.   Married  Caucasian  female   [phone] with Patient's last menstrual period was 08/18/2019 (exact date).   here for     GYNECOLOGIC HISTORY: Patient's last menstrual period was 08/18/2019 (exact date). Contraception:  PMP Menopausal hormone therapy:  *** Last mammogram:  05-21-21 Possible asymmetry left breast 06-07-21 Unilat.Left MMG Left breast mass Cat.C BiRADS 4--08-06-21 Breast Biopsy Benign Last pap smear: 09-17-21 Neg:Neg HR HPV, 02-04-18 Neg:Neg HR HPV        OB History     Gravida  4   Para  3   Term  3   Preterm  0   AB  1   Living  3      SAB  1   IAB      Ectopic      Multiple      Live Births  3              Patient Active Problem List   Diagnosis Date Noted   Bloating    Generalized abdominal pain    Adhesive capsulitis of left shoulder 11/02/2017   Irritable bowel syndrome 11/02/2017   History of BCG vaccination 10/23/2017   Depression 07/01/2017   Asthma 07/01/2017   Left thyroid nodule 01/30/2017   Prediabetes 08/03/2016    Past Medical History:  Diagnosis Date   Abnormal uterine bleeding    Asthma    Eczema    Elevated LDL cholesterol level 2020   Hypercholesterolemia    Left bundle branch block    Urinary incontinence     Past Surgical History:  Procedure Laterality Date   APPENDECTOMY  1996   BREAST BIOPSY Left 08/06/2021   stereo biopsy/ coil clip/ path pending   COLONOSCOPY WITH PROPOFOL N/A 09/09/2021   Procedure: COLONOSCOPY WITH PROPOFOL;  Surgeon: Lin Landsman, MD;  Location: [place];  Service: Gastroenterology;  Laterality: N/A;   ESOPHAGOGASTRODUODENOSCOPY (EGD) WITH PROPOFOL N/A 09/09/2021   Procedure: ESOPHAGOGASTRODUODENOSCOPY (EGD) WITH PROPOFOL;  Surgeon: Lin Landsman, MD;  Location: [facility] ENDOSCOPY;  Service: Gastroenterology;  Laterality: N/A;   KNEE ARTHROSCOPY Right 09/10/2021   Procedure: PARTIAL MEDIAL MENISCECTOMY;  Surgeon: Georgeanna Harrison, MD;   Location: [place];  Service: Orthopedics;  Laterality: Right;    Current Outpatient Medications  Medication Sig Dispense Refill   albuterol (VENTOLIN HFA) 108 (90 Base) MCG/ACT inhaler Inhale 2 puffs into the lungs every 6 (six) hours as needed for wheezing or shortness of breath. 8.5 g 0   BIOTIN PO Take by mouth daily.     CALCIUM PO Take by mouth daily as needed (per patient report).     Cholecalciferol (VITAMIN D3 PO) Take 10,000 Units by mouth daily as needed (per patient report).     COLLAGEN PO Take by mouth daily.     COVID-19 At Home Antigen Test [facility] COVID-19 HOME TEST) KIT Use as directed (Patient not taking: Reported on 10/04/2021) 2 kit 0   EPINEPHrine 0.3 mg/0.3 mL IJ SOAJ injection Inject 0.3 mg into the muscle as needed for anaphylaxis. 2 each 2   estradiol (VIVELLE-DOT) 0.05 MG/24HR patch Place 1 patch onto the skin 2 (two) times a week.     fexofenadine (ALLEGRA ALLERGY) 180 MG tablet Take 1 tablet (180 mg total) by mouth daily. 30 tablet 5   FLUoxetine (PROZAC) 20 MG capsule Take 20 mg by mouth daily.     loratadine (CLARITIN) 10 MG tablet Take 1 tablet (10 mg  total) by mouth daily. (Patient taking differently: Take 10 mg by mouth daily as needed for allergies.) 90 tablet 3   MAGNESIUM PO Take by mouth daily.     progesterone (PROMETRIUM) 100 MG capsule Take 1 capsule every day by oral route. 90 capsule 0   triamcinolone cream (KENALOG) 0.1 % Apply 1 application topically 2 (two) times daily. (Patient taking differently: Apply 1 application  topically 2 (two) times daily as needed (irritation).) 30 g 3   No current facility-administered medications for this visit.     ALLERGIES: Honey bee venom, Kiwi extract, Other, Peanut-containing drug products, Pork-derived products, Sesame seed (diagnostic), and Shrimp (diagnostic)  Family History  Problem Relation Age of Onset   Diabetes Mother    Eczema Brother    Cancer Maternal Aunt        colon cancer   Cancer Paternal  Aunt        cervical   Lung cancer Maternal Grandfather    Breast cancer Neg Hx     Social History   Socioeconomic History   Marital status: Married    Spouse name: Not on file   Number of children: Not on file   Years of education: Not on file   Highest education level: Not on file  Occupational History   Not on file  Tobacco Use   Smoking status: Never    Passive exposure: Never   Smokeless tobacco: Never  Vaping Use   Vaping Use: Never used  Substance and Sexual Activity   Alcohol use: No   Drug use: No   Sexual activity: Yes    Partners: Male    Birth control/protection: Post-menopausal  Other Topics Concern   Not on file  Social History Narrative   Not on file   Social Determinants of Health   Financial Resource Strain: Not on file  Food Insecurity: Not on file  Transportation Needs: Not on file  Physical Activity: Not on file  Stress: Not on file  Social Connections: Not on file  Intimate Partner Violence: Not on file    Review of Systems  PHYSICAL EXAMINATION:    LMP 08/18/2019 (Exact Date)     General appearance: alert, cooperative and appears stated age Head: Normocephalic, without obvious abnormality, atraumatic Neck: no adenopathy, supple, symmetrical, trachea midline and thyroid normal to inspection and palpation Lungs: clear to auscultation bilaterally Breasts: normal appearance, no masses or tenderness, No nipple retraction or dimpling, No nipple discharge or bleeding, No axillary or supraclavicular adenopathy Heart: regular rate and rhythm Abdomen: soft, non-tender, no masses,  no organomegaly Extremities: extremities normal, atraumatic, no cyanosis or edema Skin: Skin color, texture, turgor normal. No rashes or lesions Lymph nodes: Cervical, supraclavicular, and axillary nodes normal. No abnormal inguinal nodes palpated Neurologic: Grossly normal  Pelvic: External genitalia:  no lesions              Urethra:  normal appearing urethra with  no masses, tenderness or lesions              Bartholins and Skenes: normal                 Vagina: normal appearing vagina with normal color and discharge, no lesions              Cervix: no lesions                Bimanual Exam:  Uterus:  normal size, contour, position, consistency, mobility, non-tender  Adnexa: no mass, fullness, tenderness              Rectal exam: {yes no:314532}.  Confirms.              Anus:  normal sphincter tone, no lesions  Chaperone was present for exam:  ***  ASSESSMENT     PLAN     An After Visit Summary was printed and given to the patient.  ______ minutes face to face time of which over 50% was spent in counseling.

## 2021-10-07 NOTE — Telephone Encounter (Signed)
Please cancel the IUD insertion.   I am sorry that we are unable to complete her care prior to her move to [place].

## 2021-10-08 ENCOUNTER — Ambulatory Visit: Payer: No Typology Code available for payment source | Admitting: Obstetrics and Gynecology

## 2021-10-08 ENCOUNTER — Other Ambulatory Visit: Payer: Self-pay | Admitting: Family Medicine

## 2021-10-08 ENCOUNTER — Other Ambulatory Visit: Payer: Self-pay

## 2021-10-08 MED ORDER — FLUOXETINE HCL 20 MG PO CAPS
20.0000 mg | ORAL_CAPSULE | Freq: Every day | ORAL | 0 refills | Status: DC
  Filled 2021-10-08: qty 90, 90d supply, fill #0

## 2021-10-09 ENCOUNTER — Ambulatory Visit: Payer: No Typology Code available for payment source | Admitting: Obstetrics and Gynecology

## 2021-10-09 LAB — ALLERGEN HYMENOPTERA PANEL
Bumblebee: <0.1 kU/L
Honeybee IgE: 0.14 kU/L — AB
Hornet, White Face, IgE: <0.1 kU/L
Hornet, Yellow, IgE: <0.1 kU/L
Paper Wasp IgE: <0.1 kU/L
Yellow Jacket, IgE: 0.15 kU/L — AB

## 2021-10-09 NOTE — Progress Notes (Signed)
Blood work returned positive to honeybee and yellow jacket.  This indicates a persistent bee allergy.  She should continue to carry EpiPen and follow-up with an allergist when she moves to [place].  Can someone let patient know? Thanks!  Eml

## 2021-10-13 ENCOUNTER — Other Ambulatory Visit: Payer: Self-pay

## 2021-10-13 MED ORDER — ESTRADIOL 0.05 MG/24HR TD PTTW
MEDICATED_PATCH | TRANSDERMAL | 3 refills | Status: AC
  Filled 2021-10-13: qty 24, 84d supply, fill #0
  Filled 2021-12-02 – 2022-01-01 (×2): qty 24, 84d supply, fill #1
  Filled 2022-03-09: qty 24, 84d supply, fill #2
  Filled 2022-06-28: qty 8, 28d supply, fill #3

## 2021-10-14 ENCOUNTER — Telehealth: Payer: Self-pay

## 2021-10-14 ENCOUNTER — Other Ambulatory Visit: Payer: Self-pay

## 2021-10-14 NOTE — Telephone Encounter (Signed)
Patient called in triage voice mail stating she had a few questions.  I returned her call and left message in voice mail to call back.

## 2021-10-21 ENCOUNTER — Ambulatory Visit: Payer: No Typology Code available for payment source | Admitting: Family Medicine

## 2021-10-22 ENCOUNTER — Ambulatory Visit: Payer: No Typology Code available for payment source | Admitting: Obstetrics and Gynecology

## 2021-10-24 ENCOUNTER — Telehealth: Payer: Self-pay | Admitting: Gastroenterology

## 2021-10-24 NOTE — Telephone Encounter (Signed)
Pts colonoscopy results were mailed out on 06/29.2023 to [facility]

## 2021-10-30 ENCOUNTER — Telehealth: Payer: Self-pay

## 2021-10-30 NOTE — Telephone Encounter (Signed)
Patient called in - DOB verified - requesting Allergy and Asthma referral to the following:  [facility] [address] [place] [address] [place], [place] [address] Ofc: [phone] Fax: [phone] Attn.:Allergy Department  Patient advised referral will be forwarding to referral department for processing.  Patient verbalized understanding, no further questions.

## 2021-10-31 NOTE — Telephone Encounter (Signed)
What is the patient needing to be seen for ? Is she moving/transferring care?   Thanks

## 2021-10-31 NOTE — Telephone Encounter (Addendum)
Per patient and provider office note 10/04/21 - continuity of care due to relocation. Thanks.

## 2021-11-01 ENCOUNTER — Ambulatory Visit: Payer: No Typology Code available for payment source | Admitting: Internal Medicine

## 2021-11-05 NOTE — Telephone Encounter (Signed)
Records/Referral has been sent. MyChart message sent to patient.   Thanks

## 2021-11-12 ENCOUNTER — Telehealth: Payer: Self-pay | Admitting: Internal Medicine

## 2021-11-12 NOTE — Telephone Encounter (Signed)
Roiza called in and is requesting a referral to [facility] in [place].  Delsie provided information: They are located at  [address] floor [place], [place] [address]  Ph: [phone]  She would like to be called once the referral has been sent.

## 2021-11-20 ENCOUNTER — Telehealth: Payer: Self-pay | Admitting: Gastroenterology

## 2021-11-20 NOTE — Telephone Encounter (Signed)
Pts endoscopy and colonoscopy results  for 09/09/2021 were sent to [facility] on 11/20/2021 they were faxed

## 2021-12-02 ENCOUNTER — Encounter: Payer: Self-pay | Admitting: Obstetrics and Gynecology

## 2021-12-02 ENCOUNTER — Other Ambulatory Visit: Payer: Self-pay

## 2021-12-02 DIAGNOSIS — Z09 Encounter for follow-up examination after completed treatment for conditions other than malignant neoplasm: Secondary | ICD-10-CM

## 2021-12-02 NOTE — Telephone Encounter (Unsigned)
Please place order for dx left mammogram and left breast US at [facility] at [facility].  Patient is due for this follow up in October, 2023.   Please let the patient know when the orders are placed.

## 2021-12-31 ENCOUNTER — Telehealth: Payer: Self-pay

## 2021-12-31 ENCOUNTER — Encounter: Payer: Self-pay | Admitting: Internal Medicine

## 2021-12-31 NOTE — Telephone Encounter (Signed)
Patient request dietician referral per provider recommendation:  [facility]  at [facility]. [address] [place], [place] .

## 2022-01-01 ENCOUNTER — Other Ambulatory Visit (HOSPITAL_COMMUNITY): Payer: Self-pay

## 2022-01-01 ENCOUNTER — Encounter: Payer: Self-pay | Admitting: Obstetrics and Gynecology

## 2022-01-01 ENCOUNTER — Other Ambulatory Visit: Payer: Self-pay | Admitting: Family Medicine

## 2022-01-01 ENCOUNTER — Encounter: Payer: Self-pay | Admitting: Internal Medicine

## 2022-01-01 DIAGNOSIS — Z8262 Family history of osteoporosis: Secondary | ICD-10-CM

## 2022-01-01 DIAGNOSIS — Z13820 Encounter for screening for osteoporosis: Secondary | ICD-10-CM

## 2022-01-01 MED ORDER — FLUOXETINE HCL 20 MG PO CAPS
20.0000 mg | ORAL_CAPSULE | Freq: Every day | ORAL | 0 refills | Status: DC
Start: 2022-01-01 — End: 2022-01-03
  Filled 2022-01-01: qty 30, 30d supply, fill #0

## 2022-01-01 NOTE — Telephone Encounter (Signed)
Ok to order bone density at the [facility].   Diagnosis will be osteoporosis screening and family history of osteoporosis.

## 2022-01-02 ENCOUNTER — Other Ambulatory Visit (HOSPITAL_COMMUNITY): Payer: Self-pay

## 2022-01-03 ENCOUNTER — Encounter: Payer: Self-pay | Admitting: Family Medicine

## 2022-01-03 ENCOUNTER — Other Ambulatory Visit: Payer: Self-pay

## 2022-01-03 DIAGNOSIS — F329 Major depressive disorder, single episode, unspecified: Secondary | ICD-10-CM

## 2022-01-03 MED ORDER — FLUOXETINE HCL 20 MG PO CAPS
20.0000 mg | ORAL_CAPSULE | Freq: Every day | ORAL | 0 refills | Status: DC
  Filled 2022-01-03 – 2022-03-09 (×4): qty 60, 60d supply, fill #0

## 2022-01-06 ENCOUNTER — Other Ambulatory Visit: Payer: Self-pay

## 2022-01-06 ENCOUNTER — Other Ambulatory Visit (HOSPITAL_COMMUNITY): Payer: Self-pay

## 2022-01-06 MED ORDER — MONTELUKAST SODIUM 10 MG PO TABS
10.0000 mg | ORAL_TABLET | Freq: Every day | ORAL | 1 refills | Status: AC
  Filled 2022-01-06: qty 90, 90d supply, fill #0
  Filled 2022-03-09 – 2022-03-10 (×2): qty 90, 90d supply, fill #1

## 2022-01-10 NOTE — Telephone Encounter (Signed)
Called and left a voicemail for their office to see which [employer] to put the referral in.  Will call them back on Monday.

## 2022-01-13 NOTE — Telephone Encounter (Signed)
Hey Dr Edison Pace,  Patient is requesting a dietitian referral for her Food Allergies. For This referral what would be the best diagnosis?   Thanks

## 2022-01-15 NOTE — Telephone Encounter (Signed)
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  Thanks!

## 2022-01-24 ENCOUNTER — Other Ambulatory Visit (HOSPITAL_COMMUNITY): Payer: Self-pay

## 2022-03-10 ENCOUNTER — Other Ambulatory Visit (HOSPITAL_COMMUNITY): Payer: Self-pay

## 2022-03-11 ENCOUNTER — Ambulatory Visit
Admission: RE | Admit: 2022-03-11 | Discharge: 2022-03-11 | Disposition: A | Discharge status: Home / Self Care | Payer: No Typology Code available for payment source | Source: Ambulatory Visit | Attending: Obstetrics and Gynecology | Admitting: Obstetrics and Gynecology

## 2022-03-11 ENCOUNTER — Other Ambulatory Visit (HOSPITAL_COMMUNITY): Payer: Self-pay

## 2022-03-11 DIAGNOSIS — Z8262 Family history of osteoporosis: Secondary | ICD-10-CM

## 2022-03-11 DIAGNOSIS — Z13820 Encounter for screening for osteoporosis: Secondary | ICD-10-CM

## 2022-03-12 ENCOUNTER — Ambulatory Visit
Admission: RE | Admit: 2022-03-12 | Discharge: 2022-03-12 | Disposition: A | Discharge status: Home / Self Care | Payer: No Typology Code available for payment source | Source: Ambulatory Visit | Attending: Obstetrics and Gynecology | Admitting: Obstetrics and Gynecology

## 2022-03-12 ENCOUNTER — Encounter: Payer: Self-pay | Admitting: Family Medicine

## 2022-03-12 ENCOUNTER — Other Ambulatory Visit (HOSPITAL_COMMUNITY): Payer: Self-pay

## 2022-03-12 ENCOUNTER — Encounter: Payer: Self-pay | Admitting: Dietician

## 2022-03-12 ENCOUNTER — Ambulatory Visit (INDEPENDENT_AMBULATORY_CARE_PROVIDER_SITE_OTHER): Payer: No Typology Code available for payment source | Admitting: Family Medicine

## 2022-03-12 ENCOUNTER — Encounter: Payer: No Typology Code available for payment source | Attending: Internal Medicine | Admitting: Dietician

## 2022-03-12 ENCOUNTER — Other Ambulatory Visit: Payer: Self-pay

## 2022-03-12 VITALS — BP 102/62 | HR 72 | Ht 65.0 in | Wt 136.0 lb

## 2022-03-12 VITALS — Ht 66.0 in | Wt 135.4 lb

## 2022-03-12 DIAGNOSIS — M810 Age-related osteoporosis without current pathological fracture: Secondary | ICD-10-CM

## 2022-03-12 DIAGNOSIS — R627 Adult failure to thrive: Secondary | ICD-10-CM | POA: Insufficient documentation

## 2022-03-12 DIAGNOSIS — R7303 Prediabetes: Secondary | ICD-10-CM | POA: Diagnosis not present

## 2022-03-12 DIAGNOSIS — Z09 Encounter for follow-up examination after completed treatment for conditions other than malignant neoplasm: Secondary | ICD-10-CM | POA: Diagnosis not present

## 2022-03-12 DIAGNOSIS — F329 Major depressive disorder, single episode, unspecified: Secondary | ICD-10-CM

## 2022-03-12 DIAGNOSIS — E7801 Familial hypercholesterolemia: Secondary | ICD-10-CM

## 2022-03-12 DIAGNOSIS — L2084 Intrinsic (allergic) eczema: Secondary | ICD-10-CM | POA: Diagnosis not present

## 2022-03-12 DIAGNOSIS — K5282 Eosinophilic colitis: Secondary | ICD-10-CM | POA: Insufficient documentation

## 2022-03-12 DIAGNOSIS — Z91018 Allergy to other foods: Secondary | ICD-10-CM

## 2022-03-12 MED ORDER — LORATADINE 10 MG PO TABS: 10.0000 mg | ORAL_TABLET | Freq: Every day | ORAL | 1 refills | Status: AC

## 2022-03-12 MED ORDER — TRIAMCINOLONE ACETONIDE 0.1 % EX CREA
1.0000 | TOPICAL_CREAM | Freq: Two times a day (BID) | CUTANEOUS | 1 refills | Status: AC
  Filled 2022-03-12: qty 454, 30d supply, fill #0
  Filled 2022-03-12: qty 160, 16d supply, fill #0

## 2022-03-12 MED ORDER — CELECOXIB 100 MG PO CAPS
100.0000 mg | ORAL_CAPSULE | Freq: Two times a day (BID) | ORAL | 0 refills | Status: AC
  Filled 2022-03-12: qty 60, 30d supply, fill #0

## 2022-03-12 MED ORDER — ALENDRONATE SODIUM 70 MG PO TABS
70.0000 mg | ORAL_TABLET | ORAL | 1 refills | Status: AC
  Filled 2022-03-12: qty 12, 84d supply, fill #0

## 2022-03-12 MED ORDER — FLUOXETINE HCL 20 MG PO CAPS: 20.0000 mg | ORAL_CAPSULE | Freq: Every day | ORAL | 1 refills | Status: AC

## 2022-03-12 NOTE — Patient Instructions (Signed)
Continue to avoid alpha-gal foods including lamb, peanuts, and sesame.  OK to eat foods with low level (class 1 or 2) reaction. Eat moderate reaction (class 3) foods in small portions and infrequently.  Limit high fiber foods including skins and seeds from fruits, raw leafy green vegetables or tough skins and hard seeds.  Plan to eat something every 4-5 hours during the day. Include protein + a starch + a vegetable or fruit, with some olive oil or avocado.  Adding another snack or light meal might help prevent further weight loss.

## 2022-03-12 NOTE — Progress Notes (Signed)
Medical Nutrition Therapy: Visit start time: 1610  end time: 1710  Assessment:   Referral Diagnosis: adult failure to thrive Other medical history/ diagnoses: multiple food allergies including alpha gal Psychosocial issues/ stress concerns: none  Medications, supplements: reconciled list in medical record   Preferred learning method:  Auditory Visual    Current weight: 135.6lbs (with sweater, shoes) Height: 5'6" BMI: 21.85   Progress and evaluation:  Patient reports recent blood and skin testing for allergies due to chronic abdominal pain and itchy skin rash Has had abdominal pain for many years, along with diarrhea; eosinophilic colitis recently diagnosed. She has followed the elimination diet for 2 months and symptoms have improved significantly, so she has been reintroducing eggs. She does not drink cow's milk and does not eat shrimp.  She reports some weight loss, hopes to avoid further weight loss and wants to ensure nutritionally adequate eating pattern Patient reports diagnosis of prediabetes due to HbA1Cof 5.7%. She states her mother has Type 2 diabetes. Food allergies: alpha-gal related allergens: lamb, peanuts, sesame. Skin testing class 3 allergens: cow's milk, egg white, cashew, almond, hazelnut, [place] nut, coconut, pistachio, shrimp. Blood test for food allergies indicated class 1 reaction to eggplant, egg, fig, white potato, black tea, orange, garlic, chickpeas Special diet practices: avoids pork and alcohol    Dietary Intake:  Usual eating pattern includes 2 meals and 0-1 snacks per day. Dining out frequency: 0 meals per week. Who plans meals/ buys groceries? self Who prepares meals? self  Breakfast: bread, olives, cheese, tomato, cucumber, egg, greens Snack: none Lunch: none, eats 2 meals daily Snack: none Supper: beans, occ meat, salads, rice cooks mostly Mediterranean Snack: occ fruit or small portion chocolate, small cookie maybe 1-2 times a month Beverages:  water, black tea, green tea, herbal tea  Physical activity: no regular exercise   Intervention:   Nutrition Care Education:   Basic nutrition: basic food groups; appropriate nutrient balance; appropriate meal and snack schedule; general nutrition guidelines    Weight control: identifying healthy weight; eating at regular intervals, increasing healthy fats including olive oil and avocado oil; adding another light meal or snack during the day Food Allergies: advised avoidance of alpha-gal allergens (lamb, cows milk, sesame); allowing only small portions, infrequently of class 3 allergens (skin tested); allowing low level allergens including class 1 reactions on blood test Prediabetes: appropriate portions of carbohydrate foods, benefit of eating every 3-5 hours, inclusion of protein source with meals and snacks.  Other intervention notes: Patient feels better regarding eosinophilic colitis, will continue to avoid food triggers. Established other goals to allow adequate nutrition and variety of choices while managing allergic reactions.  No follow up schedule as patient is relocating to [facility].    Nutritional Diagnosis:  [place]-2.1 Inpaired nutrition utilization and [place]-2.2 Altered nutrition-related laboratory As related to food allergies, eosinophilic colitis.  As evidenced by alpha gal syndrome, class 3 allergic reactions to multiple foods. [place]-3.2 Unintentional weight loss As related to recent dietary restrictions.  As evidenced by patient with current BMI of 21.   Education Materials given:  Designer, industrial/product with food lists, sample meal pattern Visit summary with goals/ instructions   Learner/ who was taught:  Patient   Level of understanding: Verbalizes/ demonstrates competency  Demonstrated degree of understanding via:   Teach back Learning barriers: None  Willingness to learn/ readiness for change: Eager, change in progress   Monitoring and Evaluation:   follow up with RD near home  in [place] as needed.

## 2022-03-12 NOTE — Patient Instructions (Addendum)
Alendronate; Cholecalciferol Tablets What is this medication? ALENDRONATE; CHOLECALCIFEROL (a LEN droe nate; KOL e cal SIF er ol) treats osteoporosis. It works by Paramedic stronger and less likely to break (fracture). It is a combination of a bisphosphonate and a form of vitamin D. This medicine may be used for other purposes; ask your health care provider or pharmacist if you have questions. COMMON BRAND NAME(S): Fosamax Plus D What should I tell my care team before I take this medication? They need to know if you have any of these conditions: Bleeding disorder Cancer Dental disease Difficulty swallowing High levels of vitamin D in the blood Infection Kidney disease Low levels of calcium or other minerals in the blood Low red blood cell levels Stomach or intestine problems Taking steroids, such as dexamethasone or prednisone Trouble sitting or standing for 30 minutes An unusual or allergic reaction to alendronate, cholecalciferol, other medications, foods, dyes, or preservatives Pregnant or trying to get pregnant Breast-feeding How should I use this medication? Take this medication by mouth with a full glass of water. Take it as directed on the prescription label on the same day each week. Take the dose right after waking up. Do not eat or drink anything before taking it. Do not take it with any other drink except water. Do not chew or crush the tablet. After taking it, do not eat breakfast, drink, or take any other medications or vitamins for at least 30 minutes. Sit or stand up for at least 30 minutes after you take it. Do not lie down. Keep taking it unless your care team tells you to stop. A special MedGuide will be given to you by the pharmacist with each prescription and refill. Be sure to read this information carefully each time. Talk to your care team about the use of this medication in children. Special care may be needed. Overdosage: If you think you have taken too much  of this medicine contact a poison control center or emergency room at once. NOTE: This medicine is only for you. Do not share this medicine with others. What if I miss a dose? If you take your medication once a day, skip it. Take your next dose at the scheduled time the next morning. Do not take two doses on the same day. If you take your medication once a week, take the missed dose on the morning after you remember. Do not take two doses on the same day. What may interact with this medication? Aluminum hydroxide Antacids Aspirin Calcium supplements Certain medications for seizures, such as phenytoin Cholestyramine Cimetidine Colestipol Iron supplements Magnesium supplements NSAIDs, medications for pain and inflammation, such as ibuprofen or naproxen Orlistat Thiazide diuretics Vitamins with minerals This list may not describe all possible interactions. Give your health care provider a list of all the medicines, herbs, non-prescription drugs, or dietary supplements you use. Also tell them if you smoke, drink alcohol, or use illegal drugs. Some items may interact with your medicine. What should I watch for while using this medication? Visit your care team for regular checks on your progress. It may be some time before you see the benefit from this medication. Some people who take this medication have severe bone, joint, or muscle pain. This medication may also increase your risk for jaw problems or a broken thigh bone. Tell your care team right away if you have severe pain in your jaw, bones, joints, or muscles. Tell your care team if you have any pain that does  not go away or that gets worse. You should make sure you get enough calcium and vitamin D while you are taking this medication. Discuss the foods you eat and the vitamins you take with your care team. Tell your dentist and dental surgeon that you are taking this medication. You should not have major dental surgery while on this  medication. See your dentist to have a dental exam and fix any dental problems before starting this medication. Take good care of your teeth while on this medication. Make sure you see your dentist for regular follow-up appointments. What side effects may I notice from receiving this medication? Side effects that you should report to your care team as soon as possible: Allergic reactions--skin rash, itching, hives, swelling of the face, lips, tongue, or throat Low calcium level--muscle pain or cramps, confusion, tingling, or numbness in the hands or feet Osteonecrosis of the jaw--pain, swelling, or redness in the mouth, numbness of the jaw, poor healing after dental work, unusual discharge from the mouth, visible bones in the mouth Pain or trouble swallowing Severe bone, joint, or muscle pain Stomach bleeding--bloody or black, tar-like stools, vomiting blood or brown material that looks like coffee grounds Side effects that usually do not require medical attention (report to your care team if they continue or are bothersome): Constipation Diarrhea Nausea Stomach pain This list may not describe all possible side effects. Call your doctor for medical advice about side effects. You may report side effects to FDA at 1-800-FDA-1088. Where should I keep my medication? Keep out of the reach of children and pets. Store at room temperature between 20 and 25 degrees C (68 and 77 degrees F). Protect from light and moisture. Keep this medication in the original container until you are ready to take it. Get rid of any unused medication after the expiration date. To get rid of medications that are no longer needed or have expired: Take the medication to a take-back program. Check with your pharmacy or law enforcement to find a location. If you cannot return the medication, check the label or package insert to see if the medication should be thrown out in the garbage or flushed down the toilet. If you are not  sure, ask your care team. If it is safe to put it in the trash, empty the medication out of the container. Mix the medication with cat litter, dirt, coffee grounds, or other unwanted substance. Seal the mixture in a bag or container. Put it in the trash. NOTE: This sheet is a summary. It may not cover all possible information. If you have questions about this medicine, talk to your doctor, pharmacist, or health care provider.  2023 Elsevier/Gold Standard (2021-07-18 00:00:00) GUIDELINES FOR  LOW-CHOLESTEROL, LOW-TRIGLYCERIDE DIETS    FOODS TO USE   MEATS, FISH Choose lean meats (chicken, [place], veal, and non-fatty cuts of beef with excess fat trimmed; one serving = 3 oz of cooked meat). Also, fresh or frozen fish, canned fish packed in water, and shellfish (lobster, crabs, shrimp, and oysters). Limit use to no more than one serving of one of these per week. Shellfish are high in cholesterol but low in saturated fat and should be used sparingly. Meats and fish should be broiled (pan or oven) or baked on a rack.  EGGS Egg substitutes and egg whites (use freely). Egg yolks (limit two per week).  FRUITS Eat three servings of fresh fruit per day (1 serving =  cup). Be sure to have at least  one citrus fruit daily. Frozen and canned fruit with no sugar or syrup added may be used.  VEGETABLES Most vegetables are not limited (see next page). One dark-green (string beans, escarole) or one deep yellow (squash) vegetable is recommended daily. Cauliflower, broccoli, and celery, as well as potato skins, are recommended for their fiber content. (Fiber is associated with cholesterol reduction) It is preferable to steam vegetables, but they may be boiled, strained, or braised with polyunsaturated vegetable oil (see below).  BEANS Dried peas or beans (1 serving =  cup) may be used as a bread substitute.  NUTS Almonds, walnuts, and peanuts may be used sparingly  (1 serving = 1 Tablespoonful). Use pumpkin, sesame, or  sunflower seeds.  BREADS, GRAINS One roll or one slice of whole grain or enriched bread may be used, or three soda crackers or four pieces of melba toast as a substitute. Spaghetti, rice or noodles ( cup) or  large ear of corn may be used as a bread substitute. In preparing these foods do not use butter or shortening, use soft margarine. Also use egg and sugar substitutes.  Choose high fiber grains, such as oats and whole wheat.  CEREALS Use  cup of hot cereal or  cup of cold cereal per day. Add a sugar substitute if desired, with 99% fat free or skim milk.  MILK PRODUCTS Always use 99% fat free or skim milk, dairy products such as low fat cheeses (farmer's uncreamed diet cottage), low-fat yogurt, and powdered skim milk.  FATS, OILS Use soft (not stick) margarine; vegetable oils that are high in polyunsaturated fats (such as safflower, sunflower, soybean, corn, and cottonseed). Always refrigerate meat drippings to harden the fat and remove it before preparing gravies  DESSERTS, SNACKS Limit to two servings per day; substitute each serving for a bread/cereal serving: ice milk, water sherbet (1/4 cup); unflavored gelatin or gelatin flavored with sugar substitute (1/3 cup); pudding prepared with skim milk (1/2 cup); egg white souffls; unbuttered popcorn (1  cups). Substitute carob for chocolate.  BEVERAGES Fresh fruit juices (limit 4 oz per day); black coffee, plain or herbal teas; soft drinks with sugar substitutes; club soda, preferably salt-free; cocoa made with skim milk or nonfat dried milk and water (sugar substitute added if desired); clear broth. Alcohol: limit two servings per day (see second page).  MISCELLANEOUS  You may use the following freely: vinegar, spices, herbs, nonfat bouillon, mustard, Worcestershire sauce, soy sauce, flavoring essence.                  GUIDELINES FOR  LOW-CHOLESTEROL, LOW TRIGLYCERIDE DIETS    FOODS TO AVOID   MEATS, FISH Marbled beef, pork,  bacon, sausage, and other pork products; fatty fowl (duck, goose); skin and fat of [place] and chicken; processed meats; luncheon meats (salami, bologna); frankfurters and fast-food hamburgers (theyre loaded with fat); organ meats (kidneys, liver); canned fish packed in oil.  EGGS Limit egg yolks to two per week.   FRUITS Coconuts (rich in saturated fats).  VEGETABLES Avoid avocados. Starchy vegetables (potatoes, corn, lima beans, dried peas, beans) may be used only if substitutes for a serving of bread or cereal. (Baked potato skin, however, is desirable for its fiber content.  BEANS Commercial baked beans with sugar and/or pork added.  NUTS Avoid nuts.  Limit peanuts and walnuts to one tablespoonful per day.  BREADS, GRAINS Any baked goods with shortening and/or sugar. Commercial mixes with dried eggs and whole milk. Avoid sweet rolls, doughnuts, breakfast pastries (  [place]), and sweetened packaged cereals (the added sugar converts readily to triglycerides).  MILK PRODUCTS Whole milk and whole-milk packaged goods; cream; ice cream; whole-milk puddings, yogurt, or cheeses; nondairy cream substitutes.  FATS, OILS Butter, lard, animal fats, bacon drippings, gravies, cream sauces as well as palm and coconut oils. All these are high in saturated fats. Examine labels on cholesterol free products for hydrogenated fats. (These are oils that have been hardened into solids and in the process have become saturated.)  DESSERTS, SNACKS Fried snack foods like potato chips; chocolate; candies in general; jams, jellies, syrups; whole- milk puddings; ice cream and milk sherbets; hydrogenated peanut butter.  BEVERAGES Sugared fruit juices and soft drinks; cocoa made with whole milk and/or sugar. When using alcohol (1 oz liquor, 5 oz beer, or 2  oz dry table wine per serving), one serving must be substituted for one bread or cereal serving (limit, two servings of alcohol per day).   SPECIAL NOTES    Remember that even  non-limited foods should be used in moderation. While on a cholesterol-lowering diet, be sure to avoid animal fats and marbled meats. 3. While on a triglyceride-lowering diet, be sure to avoid sweets and to control the amount of carbohydrates you eat (starchy foods such as flour, bread, potatoes).While on a tri-glyceride-lowering diet, be sure to avoid sweets Buy a good low-fat cookbook, such as the one published by the American Heart Association. Consult your physician if you have any questions.               [facility] Low Glycemic Diet Plan   Low Glycemic Foods (20-49) Moderate Glycemic Foods (50-69) High Glycemic Foods (70-100)      Breakfast Creals Breakfast Cereals Breakfast Cereals  All Bran All-Bran Fruit'n Oats   Bran Buds Bran Chex   Cheerios Corn chex    Fiber One Oatmeal (not instant)   Just Right Mini-Wheats   Corn Flakes Cream of Wheat    Oat Bran Special K Swiss Muesli   Grape Nuts Grape Nut Flakes      Grits Nutri-Grain    Fruits and fruit juice: Fruits Puffed Rice Puffed Wheat    (Limit to 1-2 Servings per day) Banana (under-ride) Dates   Rice Chex Rice Krispies    Apples Apricots (fresh/dried)   Figs Grapes   Shredded Wheat Team    Blackberries Blueberries   Kiwi Mango   Total     Cherries Cranberries   Oranges Raisins     Peaches Pears    Fruits  Plums Prunes   Fruit Juices Pineapple Watermelon    Grapefruit Raspberries   Cranberry Juice Orange Juice   Banana (over-ripe)     Strawberries Tangerines      Apple Juice Grapefruit Juice   Beans and Legumes Beverages  Tomato Juice    [place]-type baked beans Sodas, sweet tea, pineapple juice   Canned pinto, kidney, or navy beans   Beans and Legumes (fresh-cooked) Green peas Vegetables  Black-eyed peas Butter Beans    Potato, baked, boiled, fried, mashed  Chick peas Lentils   Vegetables [place] fries  Green beans Lima beans   Beets Carrots   Canned or frozen corn  Kidney  beans Navy beans   Sweet potato Yam   Parsnips  Pinto beans Snow peas   Corn on the cob Winter squash      Non-starchy vegetables Grains Breads  Asparagus, avocado, broccoli, cabbage Cornmeal Rice, brown   Most breads (white and whole grain)  cauliflower, celery, cucumber, greens Rice, white Couscous   Bagels Bread sticks    lettuce, mushrooms, peppers, tomatoes  Bread stuffing Kaiser roll    okra, onions, spinach, summer squash Pasta Dinner rolls   [employer], cheese     Grains Ravioli, meat filled Spaghetti, white   Grains  Barley Bulgur    Rice, instant Tapioca, with milk    Rye Wild rice   Nuts    Cashews Macadamia   Candy and most cookies  Nuts and oils    Almonds, peanuts, sunflower seeds Snacks Snacks  hazelnuts, pecans, walnuts Chocolate Ice cream, lowfat   Donuts Corn chips    Oils that are liquid at room temperature Muffin Popcorn   Jelly beans Pretzels      Pastries  Dairy, fish, meat, soy, and eggs    Milk, skim Lowfat cheese    Restaurant and ethnic foods  Yogurt, lowfat, fruit sugar sweetened  Most [place] food (sugar in stir fry    or wok sauce)  Lean red meat Fish    Teriyaki-style meats and vegetables  Skinless chicken and [place], shellfish        Egg whites (up to 3 daily), Soy Products    Egg yolks (up to 7 or _____ per week)

## 2022-03-12 NOTE — Progress Notes (Signed)
Date:  03/12/2022   Name:  Autumn Brown   DOB:  1965/10/16   MRN:  [id number]   Chief Complaint: Allergic Rhinitis , Depression, skin itching, and Osteoporosis  Depression        This is a chronic problem.  The current episode started more than 1 year ago.   The problem occurs intermittently.  Associated symptoms include helplessness, insomnia and sad.  Associated symptoms include no decreased concentration, no fatigue, no hopelessness, not irritable, no restlessness, no decreased interest, no appetite change, no body aches, no myalgias, no headaches, no indigestion and no suicidal ideas.     The symptoms are aggravated by nothing.  Past treatments include SSRIs - Selective serotonin reuptake inhibitors.  Compliance with treatment is good.   Lab Results  Component Value Date   NA 140 09/10/2021   K 3.8 09/10/2021   CO2 24 09/10/2021   GLUCOSE 94 09/10/2021   BUN 9 09/10/2021   CREATININE 0.59 09/10/2021   CALCIUM 8.8 (L) 09/10/2021   GFRNONAA >60 09/10/2021   Lab Results  Component Value Date   CHOL 203 (H) 05/11/2018   HDL 40 05/11/2018   LDLCALC 140 (H) 05/11/2018   TRIG 117 05/11/2018   CHOLHDL 5.1 (H) 05/11/2018   Lab Results  Component Value Date   TSH 1.680 05/11/2018   No results found for: "HGBA1C" Lab Results  Component Value Date   WBC 3.9 (L) 09/10/2021   HGB 12.3 09/10/2021   HCT 38.3 09/10/2021   MCV 93.2 09/10/2021   PLT 173 09/10/2021   Lab Results  Component Value Date   ALT 17 05/11/2018   AST 14 05/11/2018   ALKPHOS 55 05/11/2018   BILITOT 0.3 05/11/2018   Lab Results  Component Value Date   VD25OH 35.1 05/11/2018     Review of Systems  Constitutional:  Negative for appetite change and fatigue.  HENT:  Negative for congestion and postnasal drip.   Respiratory:  Negative for cough, shortness of breath and wheezing.   Cardiovascular:  Negative for chest pain, palpitations and leg swelling.  Musculoskeletal:  Negative for myalgias.   Neurological:  Negative for headaches.  Psychiatric/Behavioral:  Positive for depression. Negative for decreased concentration and suicidal ideas. The patient has insomnia.     Patient Active Problem List   Diagnosis Date Noted   Bloating    Generalized abdominal pain    Adhesive capsulitis of left shoulder 11/02/2017   Irritable bowel syndrome 11/02/2017   History of BCG vaccination 10/23/2017   Depression 07/01/2017   Asthma 07/01/2017   Left thyroid nodule 01/30/2017   Prediabetes 08/03/2016    Allergies  Allergen Reactions   Honey Bee Venom Itching, Shortness Of Breath and Swelling   Kiwi Extract Swelling   Other     Lamb (meat) Per allergy test    Peanut-Containing Drug Products     Per allergy test    Pork-Derived Products     Against religion to eat   Sesame Seed (Diagnostic)     Per allergy test    Shrimp (Diagnostic)     Per allergy test     Past Surgical History:  Procedure Laterality Date   APPENDECTOMY  1996   BREAST BIOPSY Left 08/06/2021   stereo biopsy/ coil clip/ path pending   COLONOSCOPY WITH PROPOFOL N/A 09/09/2021   Procedure: COLONOSCOPY WITH PROPOFOL;  Surgeon: Toney Reil, MD;  Location: [facility] ENDOSCOPY;  Service: Gastroenterology;  Laterality: N/A;   ESOPHAGOGASTRODUODENOSCOPY (EGD) WITH PROPOFOL  N/A 09/09/2021   Procedure: ESOPHAGOGASTRODUODENOSCOPY (EGD) WITH PROPOFOL;  Surgeon: Toney Reil, MD;  Location[facility] ENDOSCOPY;  Service: Gastroenterology;  Laterality: N/A;   KNEE ARTHROSCOPY Right 09/10/2021   Procedure: PARTIAL MEDIAL MENISCECTOMY;  Surgeon: Ernestina Columbia, MD;  Location: [facility] OR;  Service: Orthopedics;  Laterality: Right;    Social History   Tobacco Use   Smoking status: Never    Passive exposure: Never   Smokeless tobacco: Never  Vaping Use   Vaping Use: Never used  Substance Use Topics   Alcohol use: No   Drug use: No     Medication list has been reviewed and updated.  Current Meds  Medication Sig    albuterol (VENTOLIN HFA) 108 (90 Base) MCG/ACT inhaler Inhale 2 puffs into the lungs every 6 (six) hours as needed for wheezing or shortness of breath.   COLLAGEN PO Take by mouth daily.   EPINEPHrine 0.3 mg/0.3 mL IJ SOAJ injection Inject 0.3 mg into the muscle as needed for anaphylaxis.   estradiol (VIVELLE-DOT) 0.05 MG/24HR patch Place 1 patch onto the skin 2 (two) times a week.   estradiol (VIVELLE-DOT) 0.05 MG/24HR patch Apply 1 patch twice a week as directed (Patient taking differently: Place onto the skin. OBGYN)   fexofenadine (ALLEGRA ALLERGY) 180 MG tablet Take 1 tablet (180 mg total) by mouth daily.   FLUoxetine (PROZAC) 20 MG capsule Take 1 capsule by mouth daily.   loratadine (CLARITIN) 10 MG tablet Take 1 tablet (10 mg total) by mouth daily. (Patient taking differently: Take 10 mg by mouth daily as needed for allergies.)   MAGNESIUM PO Take by mouth daily.   montelukast (SINGULAIR) 10 MG tablet Take 1 tablet (10 mg total) by mouth at bedtime.   progesterone (PROMETRIUM) 100 MG capsule Take 1 capsule every day by oral route.   triamcinolone cream (KENALOG) 0.1 % Apply 1 application topically 2 (two) times daily. (Patient taking differently: Apply 1 application  topically 2 (two) times daily as needed (irritation).)   [DISCONTINUED] BIOTIN PO Take by mouth daily.       03/12/2022   11:27 AM 05/02/2021   11:31 AM  GAD 7 : Generalized Anxiety Score  Nervous, Anxious, on Edge 0 0  Control/stop worrying 0 0  Worry too much - different things 0 0  Trouble relaxing 0 0  Restless 0 0  Easily annoyed or irritable 0 0  Afraid - awful might happen 0 0  Total GAD 7 Score 0 0  Anxiety Difficulty Not difficult at all Not difficult at all       03/12/2022   11:26 AM 05/02/2021   11:30 AM 02/17/2019    1:33 PM  Depression screen PHQ 2/9  Decreased Interest 0 0 0  Down, Depressed, Hopeless 0 0 0  PHQ - 2 Score 0 0 0  Altered sleeping 0 0 0  Tired, decreased energy 0 0 0  Change in  appetite 0 0 0  Feeling bad or failure about yourself  0 0 0  Trouble concentrating 0 0 0  Moving slowly or fidgety/restless 0 0 0  Suicidal thoughts 0 0 0  PHQ-9 Score 0 0 0  Difficult doing work/chores Not difficult at all Not difficult at all     BP Readings from Last 3 Encounters:  03/12/22 102/62  10/04/21 110/60  09/16/21 100/64    Physical Exam Constitutional:      General: She is not irritable.    Appearance: Normal appearance.  Neck:  Thyroid: No thyroid tenderness.  Cardiovascular:     Heart sounds: S1 normal.     No systolic murmur is present.     No diastolic murmur is present.     No S3 or S4 sounds.  Pulmonary:     Breath sounds: No decreased breath sounds, wheezing, rhonchi or rales.  Lymphadenopathy:     Cervical: No cervical adenopathy.  Skin:    Capillary Refill: Capillary refill takes less than 2 seconds.     Coloration: Skin is not pale.  Neurological:     Mental Status: She is alert.     Wt Readings from Last 3 Encounters:  03/12/22 136 lb (61.7 kg)  10/04/21 135 lb 3.2 oz (61.3 kg)  09/10/21 143 lb (64.9 kg)    BP 102/62   Pulse 72   Ht 5\' 5"  (1.651 m)   Wt 136 lb (61.7 kg)   LMP 08/18/2019 (Exact Date)   SpO2 97%   BMI 22.63 kg/m   Assessment and Plan:  1. Reactive depression Chronic.  Controlled.  PHQ score is 0.  Continue Prozac 20 mg once a day. - FLUoxetine (PROZAC) 20 MG capsule; Take 1 capsule by mouth daily.  Dispense: 90 capsule; Refill: 1  2. Intrinsic atopic dermatitis .  Chronic.  Uncontrolled.  Relatively stable.  There are no hotspots that noted but there is generally pruritus.  Reemphasized taking of loratadine 10 mg once a day and triamcinolone has been given as a larger dosing. - loratadine (CLARITIN) 10 MG tablet; Take 1 tablet (10 mg total) by mouth daily.  Dispense: 90 tablet; Refill: 1 - triamcinolone cream (KENALOG) 0.1 %; Apply 1 application topically 2 (two) times daily.  Dispense: 454 g; Refill: 1  3.  Hypocalcemia Noted hypocalcemia by 1/10 of a point and we will repeat renal function panel with phosphorus.  4. Prediabetes Prediabetic A1c noted on last A1c and will repeat A1c. - Hemoglobin A1c  5. Familial hypercholesterolemia Mild elevation of LDL in the 120 range we will repeat lipid panel patient has been provided with low-cholesterol low triglyceride guidelines. - Lipid Panel With LDL/HDL Ratio  6. Age-related osteoporosis without current pathological fracture Patient had screening for osteoporosis at an outside mobile unit.  And report is consistent with osteoporosis patient has been given information on alendronate and prescription has been called and and patient's to decide whether or not to initiate.  We will get a vitamin D reading and patient has been suggested to go ahead and start on at least 400 units of vitamin D and 800 if unable to take Fosamax. - alendronate (FOSAMAX) 70 MG tablet; Take 1 tablet (70 mg total) by mouth every 7 (seven) days. Take with a full glass of water on an empty stomach.  Dispense: 12 tablet; Refill: 1 - VITAMIN D 25 Hydroxy (Vit-D Deficiency, Fractures)    08/20/2019, MD

## 2022-03-14 LAB — HEMOGLOBIN A1C
Est. average glucose Bld gHb Est-mCnc: 114 mg/dL
Hgb A1c MFr Bld: 5.6 % (ref 4.8–5.6)

## 2022-03-14 LAB — LIPID PANEL WITH LDL/HDL RATIO
Cholesterol, Total: 217 mg/dL — ABNORMAL HIGH (ref 100–199)
HDL: 49 mg/dL (ref >39)
LDL Chol Calc (NIH): 154 mg/dL — ABNORMAL HIGH (ref 0–99)
LDL/HDL Ratio: 3.1 ratio (ref 0.0–3.2)
Triglycerides: 78 mg/dL (ref 0–149)
VLDL Cholesterol Cal: 14 mg/dL (ref 5–40)

## 2022-03-14 LAB — VITAMIN D 25 HYDROXY (VIT D DEFICIENCY, FRACTURES): Vit D, 25-Hydroxy: 33.6 ng/mL (ref 30.0–100.0)

## 2022-03-27 ENCOUNTER — Other Ambulatory Visit (HOSPITAL_COMMUNITY): Payer: Self-pay

## 2022-04-21 ENCOUNTER — Other Ambulatory Visit: Payer: Self-pay | Admitting: Family Medicine

## 2022-04-22 ENCOUNTER — Other Ambulatory Visit: Payer: Self-pay

## 2022-04-23 ENCOUNTER — Other Ambulatory Visit (HOSPITAL_COMMUNITY): Payer: Self-pay

## 2022-04-23 ENCOUNTER — Other Ambulatory Visit: Payer: Self-pay

## 2022-04-29 ENCOUNTER — Other Ambulatory Visit: Payer: Self-pay

## 2022-05-03 ENCOUNTER — Other Ambulatory Visit (HOSPITAL_COMMUNITY): Payer: Self-pay

## 2022-05-05 ENCOUNTER — Other Ambulatory Visit (HOSPITAL_COMMUNITY): Payer: Self-pay

## 2022-05-20 NOTE — Addendum Note (Signed)
Addended by: Burnice Logan on: 05/20/2022 12:23 PM   Modules accepted: Orders

## 2022-06-17 ENCOUNTER — Other Ambulatory Visit (HOSPITAL_COMMUNITY): Payer: Self-pay

## 2022-06-29 ENCOUNTER — Other Ambulatory Visit (HOSPITAL_COMMUNITY): Payer: Self-pay

## 2022-06-30 ENCOUNTER — Other Ambulatory Visit: Payer: Self-pay

## 2022-07-01 ENCOUNTER — Other Ambulatory Visit (HOSPITAL_COMMUNITY): Payer: Self-pay

## 2022-07-01 ENCOUNTER — Other Ambulatory Visit: Payer: Self-pay

## 2022-07-02 ENCOUNTER — Other Ambulatory Visit: Payer: Self-pay

## 2022-08-22 ENCOUNTER — Other Ambulatory Visit: Payer: Self-pay

## 2022-08-22 MED ORDER — PROGESTERONE MICRONIZED 100 MG PO CAPS
100.0000 mg | ORAL_CAPSULE | Freq: Every day | ORAL | 3 refills | Status: AC
  Filled 2022-08-22: qty 30, 30d supply, fill #0

## 2022-08-25 ENCOUNTER — Other Ambulatory Visit: Payer: Self-pay

## 2022-10-06 IMAGING — MG MM BREAST BX W LOC DEV 1ST LESION IMAGE BX SPEC STEREO GUIDE*L*
8 of 13 series · 8 of 21 positions shown · non-contrast
Comparison: Previous exams.
COMPARISON: Previous exams.

Addendum:
CLINICAL DATA: 55-year-old female presenting for biopsy of a mass
in the left breast.

EXAM:
LEFT BREAST STEREOTACTIC CORE NEEDLE BIOPSY

[L (1 of 8)]
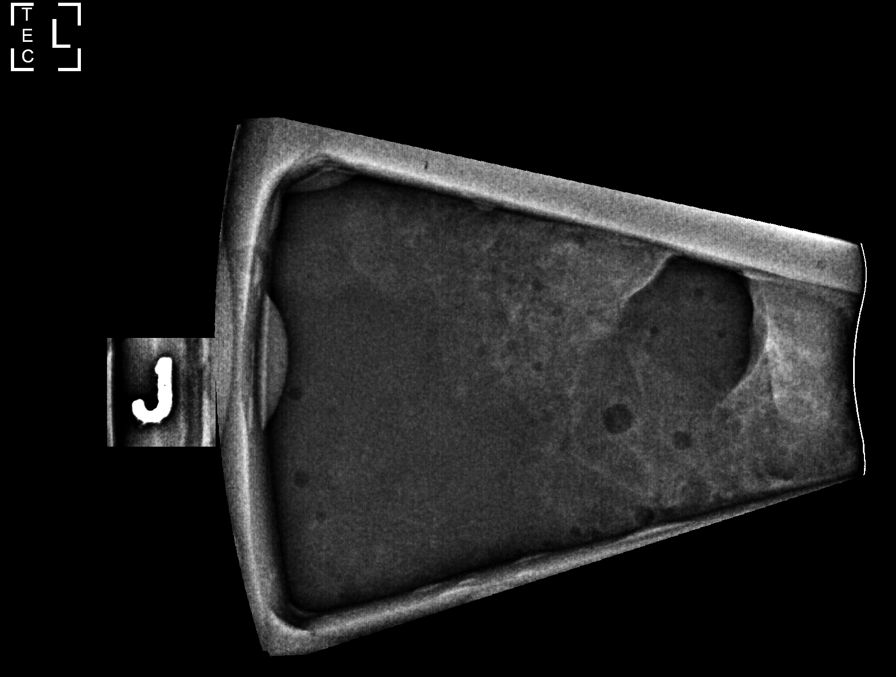

[L (2 of 8)]
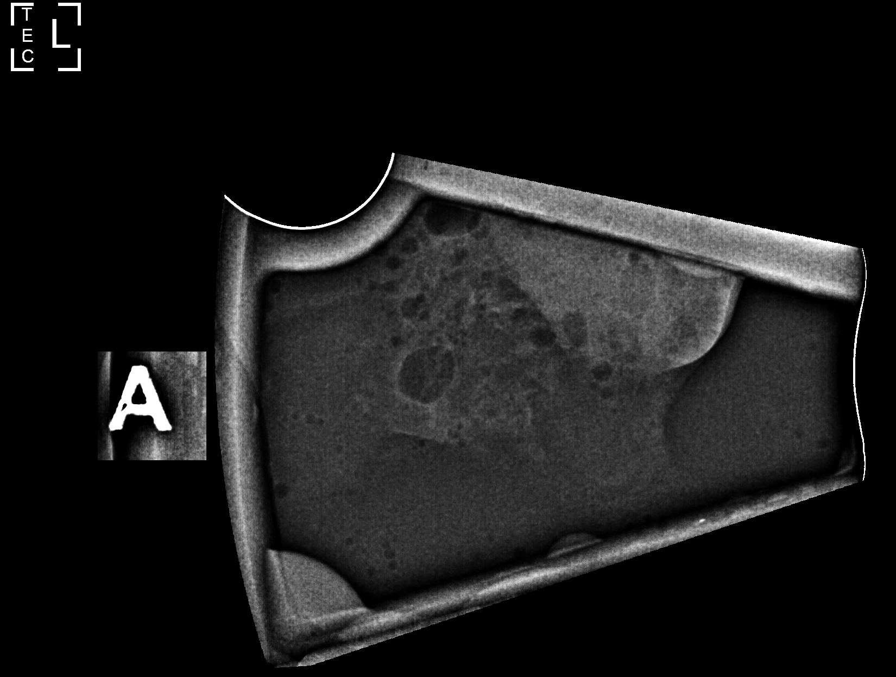

[L (3 of 8)]
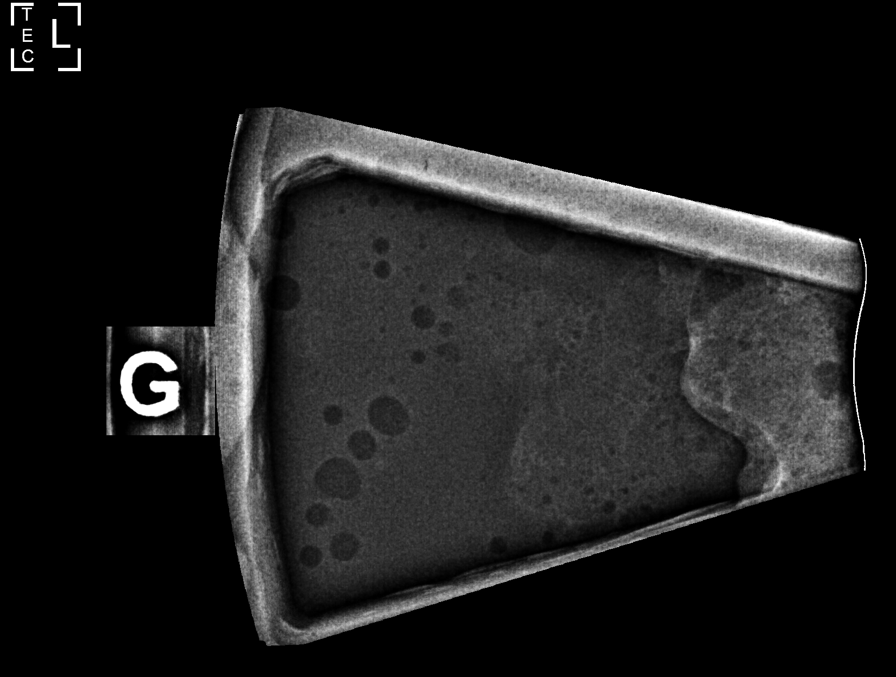

[L (4 of 8)]
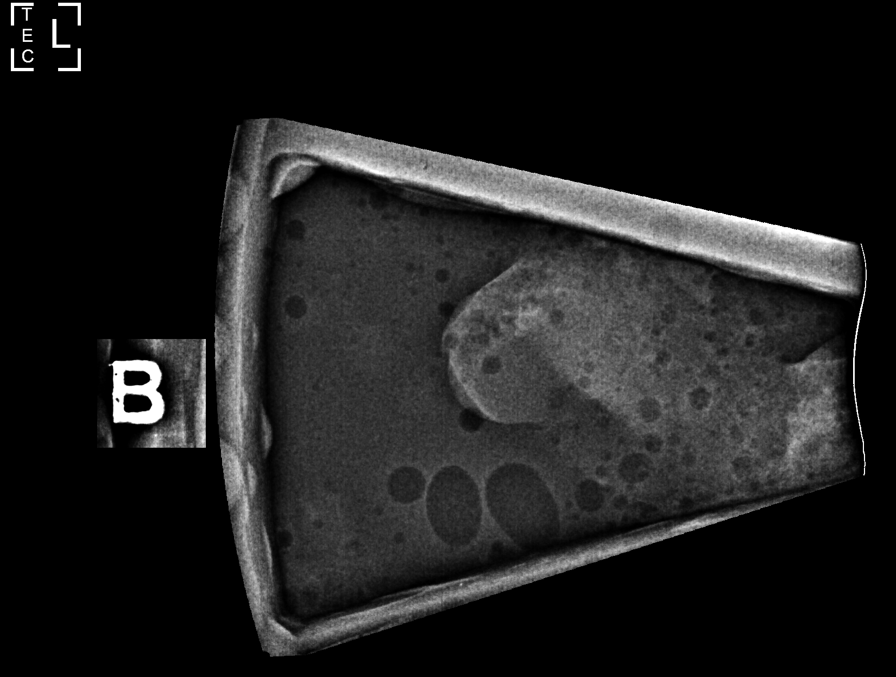

[L (5 of 8)]
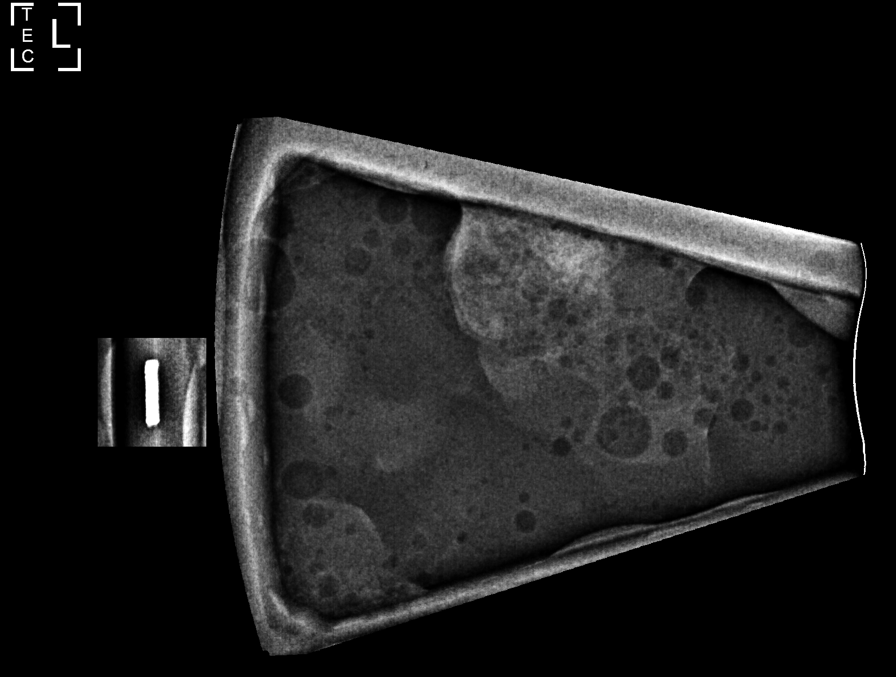

[L (6 of 8)]
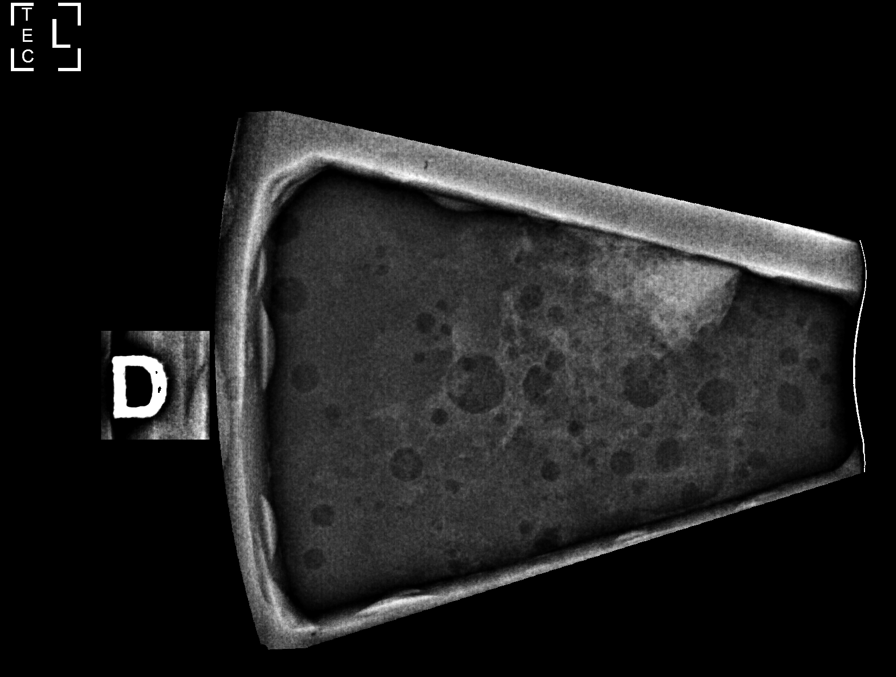

[L (7 of 8)]
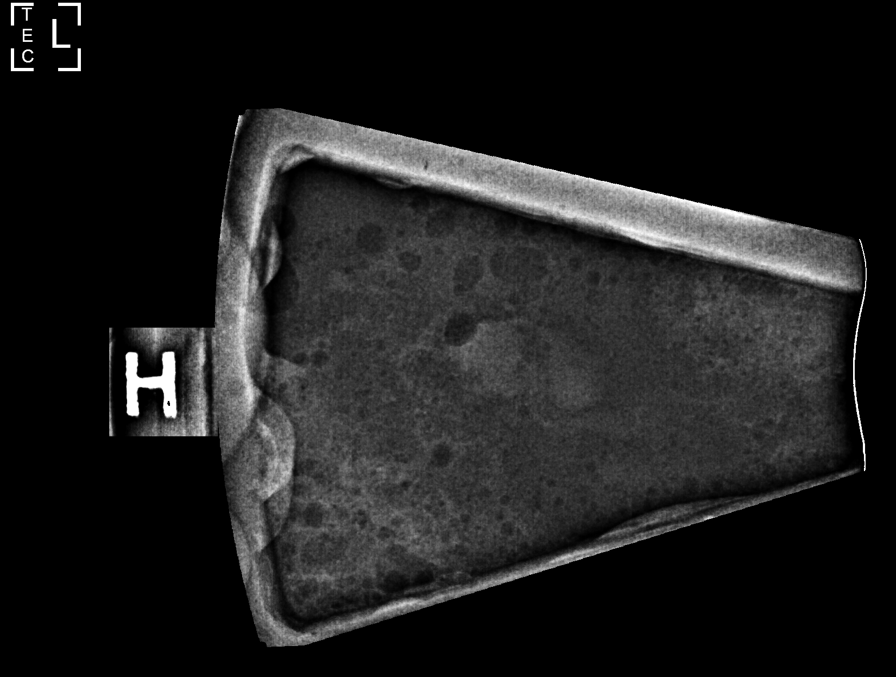

[L (8 of 8)]
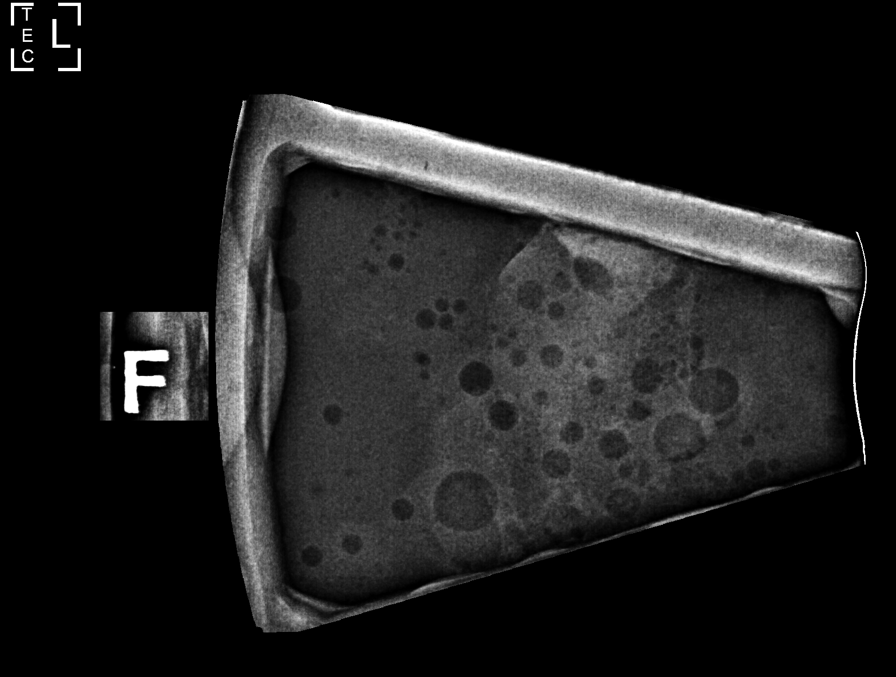

[8 of 21 positions shown; findings below may reference images not displayed]



Using sterile technique and 1% Lidocaine as local anesthetic, under
stereotactic guidance, a 9 gauge vacuum assisted device was used to
perform core needle biopsy of a mass in the upper outer left breast
using a lateral approach.

Lesion quadrant: Upper outer quadrant

At the conclusion of the procedure, a coil shaped tissue marker clip
was deployed into the biopsy cavity. Follow-up 2-view mammogram was
performed and dictated separately.
IMPRESSION: Stereotactic-guided biopsy of a mass in the upper outer left breast.
No apparent complications.

ADDENDUM:
PATHOLOGY revealed: A. BREAST, LEFT UPPER OUTER QUADRANT;
STEREOTACTIC CORE NEEDLE BIOPSY: - BENIGN MAMMARY PARENCHYMA WITH
DENSE STROMAL FIBROSIS AND MILD FIBROCYSTIC CHANGES, WITH FOCAL
USUAL DUCTAL HYPERPLASIA, AND FOCAL ASSOCIATED MICROCALCIFICATIONS.
- NEGATIVE FOR ATYPICAL PROLIFERATIVE BREAST DISEASE

Pathology results are CONCORDANT with imaging findings, per Dr.
Roxanne Machen.

Pathology results and recommendations were discussed with patient
via telephone on 08/07/2021. Patient reported biopsy site doing well
with no adverse symptoms, and only slight tenderness at the site.
Post biopsy care instructions were reviewed, questions were answered
and my direct phone number was provided. Patient was instructed to
call [[email or web address]] for any additional questions or concerns
related to biopsy site.

RECOMMENDATION: Patient instructed to return in six months for
unilateral LEFT breast diagnostic mammogram and possible ultrasound
to ensure stability of biopsied area. Patient informed a reminder
notice will be sent regarding this appointment and she will need to
call mammography site to schedule this appointment.

Pathology results reported by Willibroad Albertine RN on 08/08/2021.



Using sterile technique and 1% Lidocaine as local anesthetic, under
stereotactic guidance, a 9 gauge vacuum assisted device was used to
perform core needle biopsy of a mass in the upper outer left breast
using a lateral approach.

Lesion quadrant: Upper outer quadrant

At the conclusion of the procedure, a coil shaped tissue marker clip
was deployed into the biopsy cavity. Follow-up 2-view mammogram was
performed and dictated separately.
IMPRESSION: Stereotactic-guided biopsy of a mass in the upper outer left breast.
No apparent complications.

## 2022-11-06 IMAGING — US US PELVIS COMPLETE WITH TRANSVAGINAL
1 series · 15 of 25 positions shown · non-contrast
Comparison: Prior ultrasound from 09/15/2019.

CLINICAL DATA: Initial evaluation for endometrial thickening, on
HRT.

EXAM:
TRANSABDOMINAL AND TRANSVAGINAL ULTRASOUND OF PELVIS
TECHNIQUE: Both transabdominal and transvaginal ultrasound examinations of the
pelvis were performed. Transabdominal technique was performed for
global imaging of the pelvis including uterus, ovaries, adnexal
regions, and pelvic cul-de-sac. It was necessary to proceed with
endovaginal exam following the transabdominal exam to visualize the
endometrium and ovaries.

[Series 1: us pelvis complete with transvaginal · 15 of 44 slices shown]
[im 1/44]
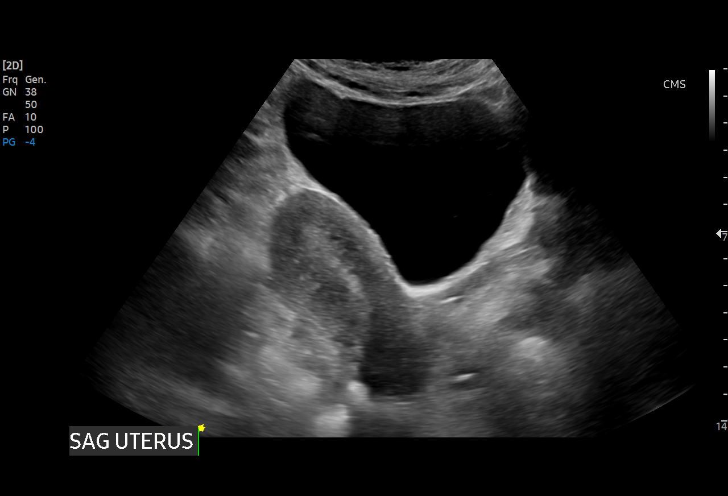
[im 4/44]
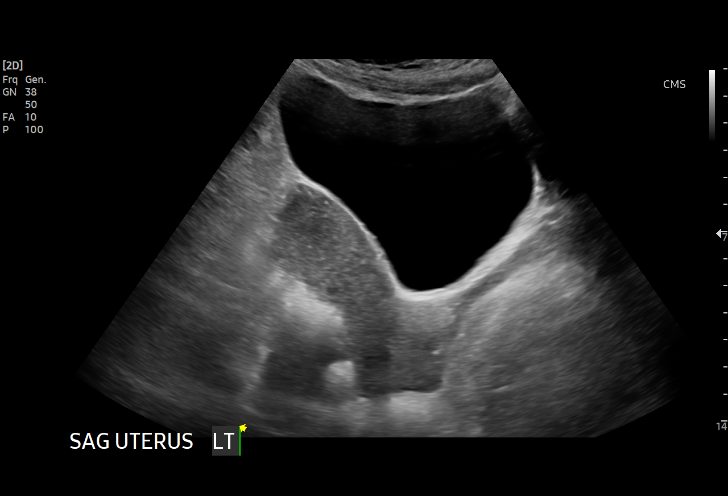
[im 8/44]
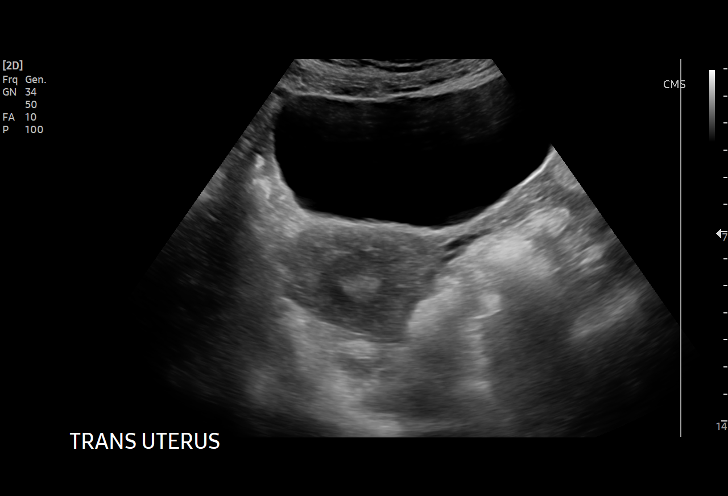
[im 9/44]
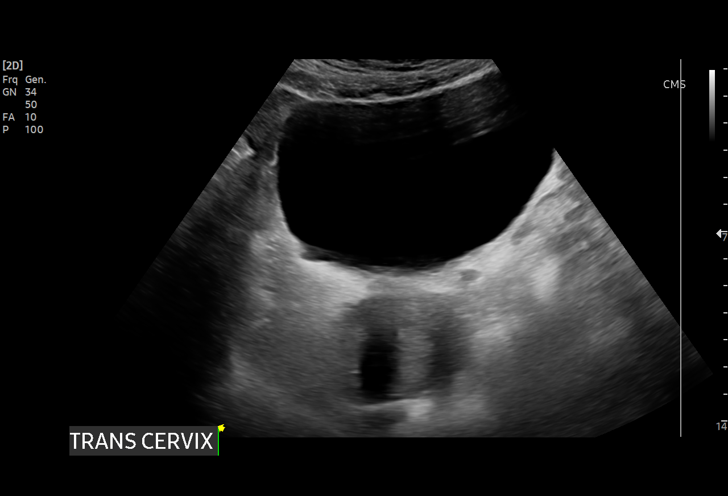
[im 13/44]
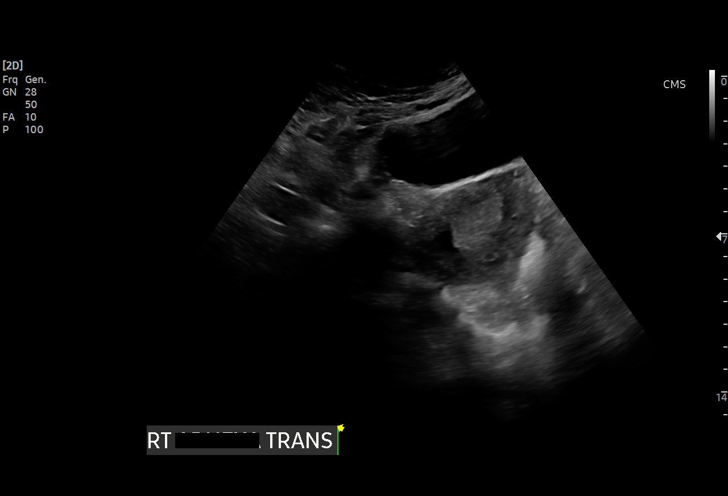
[im 17/44]
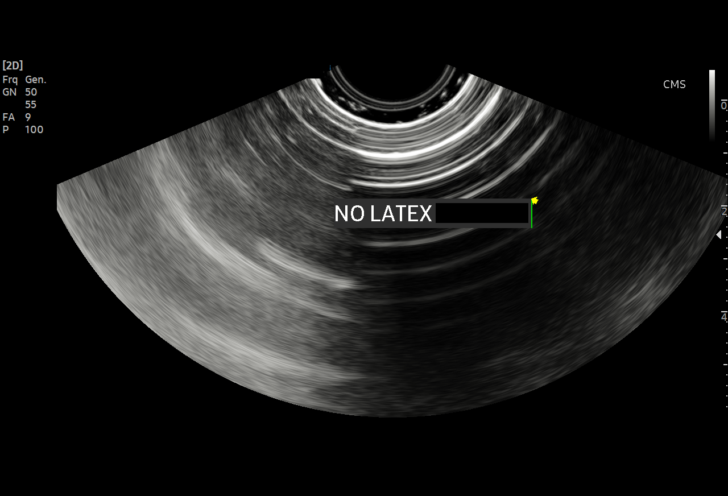
[im 18/44]
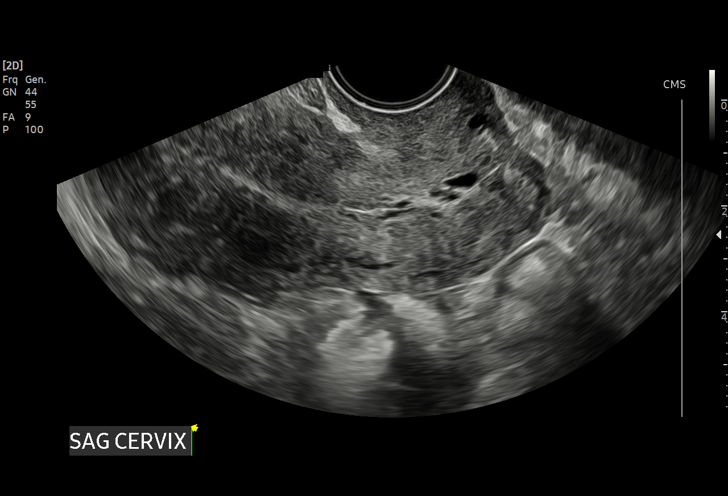
[im 22/44]
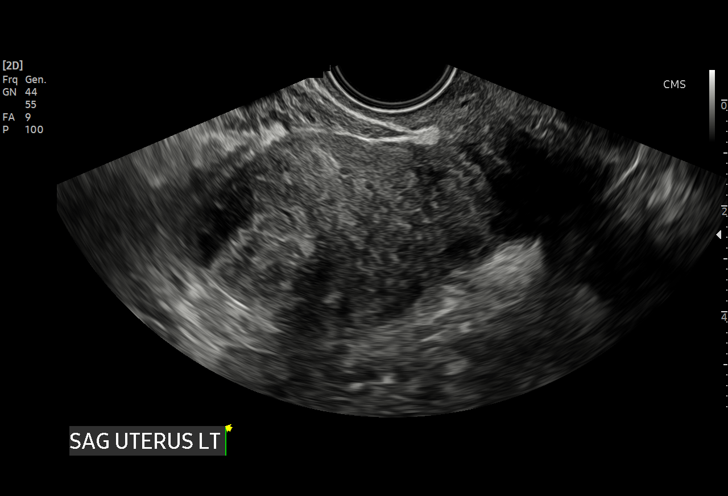
[im 26/44]
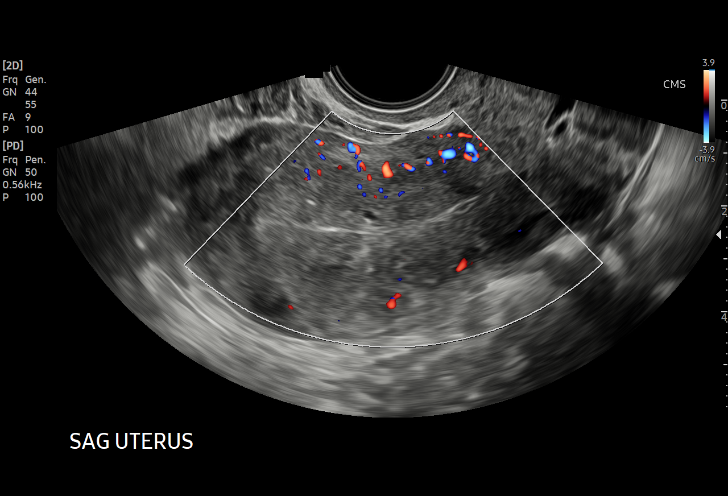
[im 27/44]
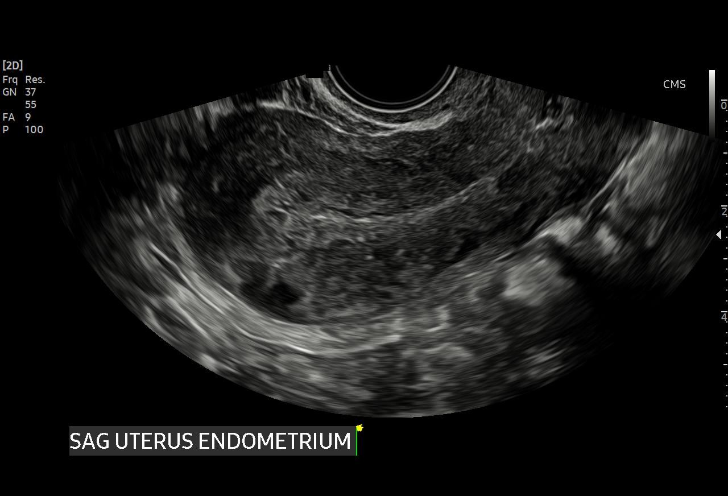
[im 31/44]
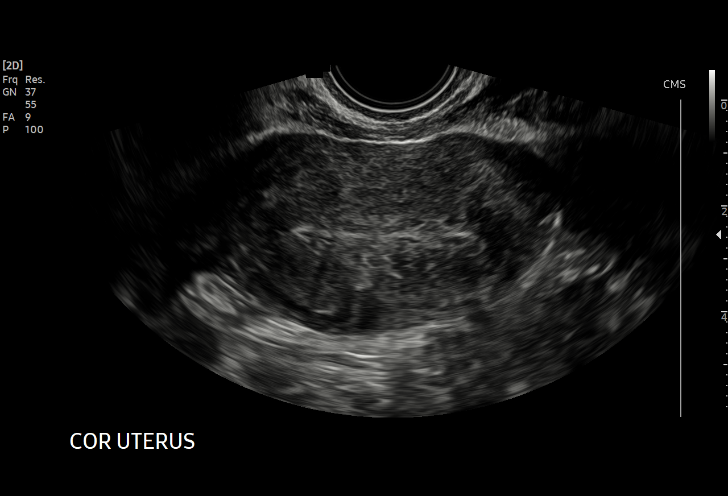
[im 35/44]
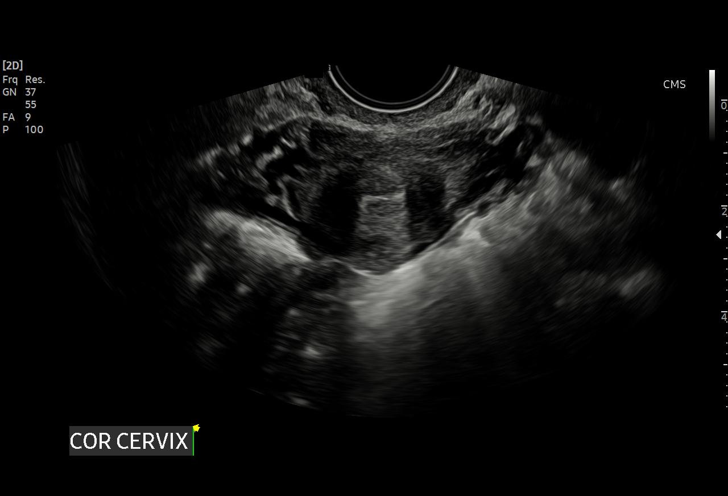
[im 36/44]
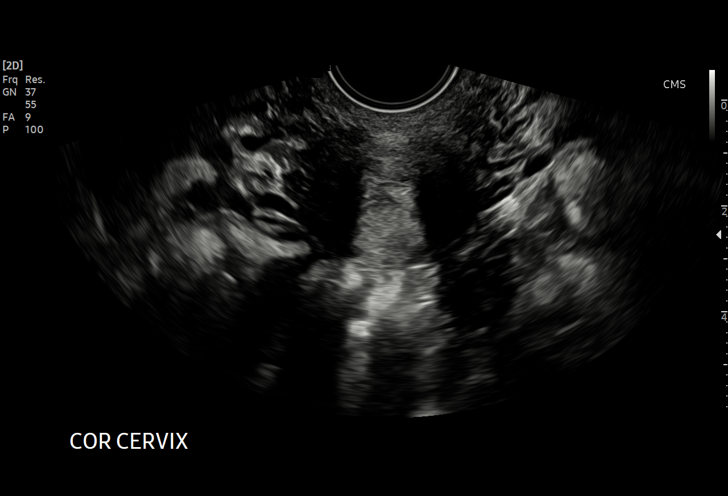
[im 40/44]
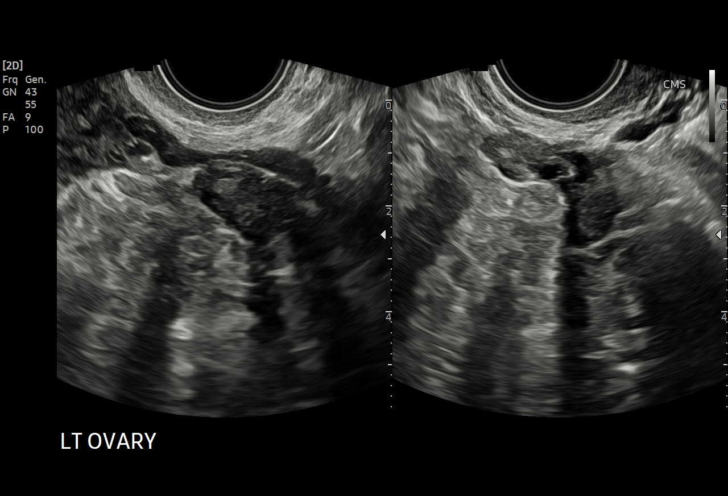
[im 44/44]
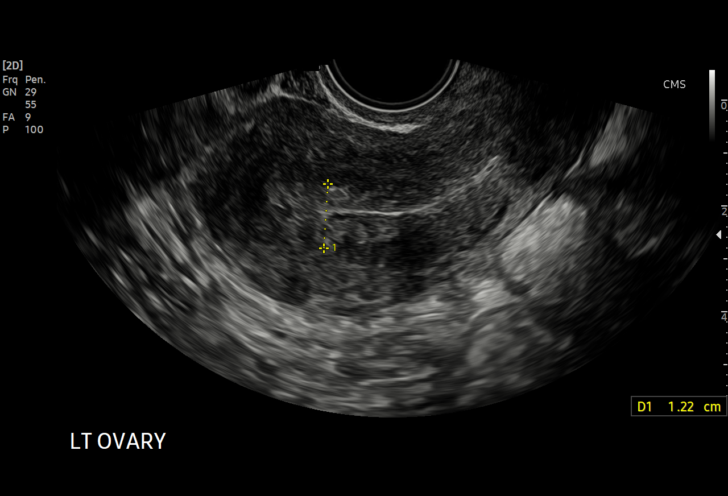

[15 of 25 positions shown; findings below may reference images not displayed]

FINDINGS: Uterus

Measurements: 9.2 x 4.3 x 6.4 cm = volume: 132.6 mL. Uterus is
anteverted. Heterogeneous echotexture seen within the uterine
myometrium without discrete fibroid.

Endometrium

Thickness: 13.8 mm. Endometrial complex demonstrates a heterogeneous
echotexture without focal lesion.

Right ovary

Measurements: 2.3 x 1.5 x 1.7 cm = volume: 3.1 mL. Normal
appearance/no adnexal mass.

Left ovary

Measurements: 2.0 x 1.2 x 0.9 cm = volume: 1.2 mL. Normal
appearance/no adnexal mass.

Other findings

No abnormal free fluid.
IMPRESSION: 1. Endometrial stripe heterogeneous and thickened up to 13.8 mm,
considered abnormal for an asymptomatic post-menopausal female.
Endometrial sampling should be considered to exclude carcinoma.
2. Otherwise unremarkable pelvic ultrasound.

## 2022-11-12 ENCOUNTER — Other Ambulatory Visit: Payer: Self-pay
